# Patient Record
Sex: Female | Born: 1975 | Race: White | Hispanic: No | Marital: Married | Smoking: Never smoker
Health system: Southern US, Community
[De-identification: ages and names within clinical notes are randomized; demographics above are authoritative.]

---

## 2002-08-05 ENCOUNTER — Other Ambulatory Visit: Admission: RE | Admit: 2002-08-05 | Discharge: 2002-08-05 | Payer: Self-pay | Admitting: Obstetrics & Gynecology

## 2003-08-06 ENCOUNTER — Other Ambulatory Visit: Admission: RE | Admit: 2003-08-06 | Discharge: 2003-08-06 | Payer: Self-pay | Admitting: Obstetrics and Gynecology

## 2004-08-06 ENCOUNTER — Other Ambulatory Visit: Admission: RE | Admit: 2004-08-06 | Discharge: 2004-08-06 | Payer: Self-pay | Admitting: Obstetrics and Gynecology

## 2005-07-18 ENCOUNTER — Emergency Department (HOSPITAL_COMMUNITY): Admission: EM | Admit: 2005-07-18 | Discharge: 2005-07-18 | Payer: Self-pay | Admitting: Family Medicine

## 2005-08-24 ENCOUNTER — Other Ambulatory Visit: Admission: RE | Admit: 2005-08-24 | Discharge: 2005-08-24 | Payer: Self-pay | Admitting: Obstetrics and Gynecology

## 2005-11-28 HISTORY — PX: WISDOM TOOTH EXTRACTION: SHX21

## 2007-12-15 ENCOUNTER — Emergency Department (HOSPITAL_COMMUNITY): Admission: EM | Admit: 2007-12-15 | Discharge: 2007-12-15 | Payer: Self-pay | Admitting: Emergency Medicine

## 2008-01-21 ENCOUNTER — Emergency Department (HOSPITAL_COMMUNITY): Admission: EM | Admit: 2008-01-21 | Discharge: 2008-01-21 | Payer: Self-pay | Admitting: Emergency Medicine

## 2008-03-28 ENCOUNTER — Emergency Department (HOSPITAL_COMMUNITY): Admission: EM | Admit: 2008-03-28 | Discharge: 2008-03-28 | Payer: Self-pay | Admitting: Emergency Medicine

## 2009-07-04 ENCOUNTER — Emergency Department (HOSPITAL_COMMUNITY): Admission: EM | Admit: 2009-07-04 | Discharge: 2009-07-04 | Payer: Self-pay | Admitting: Family Medicine

## 2009-12-01 ENCOUNTER — Emergency Department (HOSPITAL_COMMUNITY): Admission: EM | Admit: 2009-12-01 | Discharge: 2009-12-01 | Payer: Self-pay | Admitting: Family Medicine

## 2011-12-26 ENCOUNTER — Other Ambulatory Visit: Payer: Self-pay | Admitting: Obstetrics and Gynecology

## 2011-12-26 DIAGNOSIS — R928 Other abnormal and inconclusive findings on diagnostic imaging of breast: Secondary | ICD-10-CM

## 2012-01-03 ENCOUNTER — Ambulatory Visit
Admission: RE | Admit: 2012-01-03 | Discharge: 2012-01-03 | Disposition: A | Discharge status: Home / Self Care | Payer: 59 | Source: Ambulatory Visit | Attending: Obstetrics and Gynecology | Admitting: Obstetrics and Gynecology

## 2012-01-03 DIAGNOSIS — R928 Other abnormal and inconclusive findings on diagnostic imaging of breast: Secondary | ICD-10-CM

## 2012-03-28 ENCOUNTER — Ambulatory Visit (INDEPENDENT_AMBULATORY_CARE_PROVIDER_SITE_OTHER): Payer: 59 | Admitting: Family Medicine

## 2012-03-28 VITALS — BP 110/70 | Ht 59.0 in | Wt 120.0 lb

## 2012-03-28 DIAGNOSIS — M79673 Pain in unspecified foot: Secondary | ICD-10-CM | POA: Insufficient documentation

## 2012-03-28 DIAGNOSIS — M25569 Pain in unspecified knee: Secondary | ICD-10-CM

## 2012-03-28 DIAGNOSIS — M79609 Pain in unspecified limb: Secondary | ICD-10-CM

## 2012-03-28 MED ORDER — MELOXICAM 15 MG PO TABS: 15.0000 mg | ORAL_TABLET | Freq: Every day | ORAL | Status: DC

## 2012-03-28 NOTE — Progress Notes (Signed)
  Subjective:    Patient ID: Michaela Mitchell, female    DOB: 28-Jul-1976, 36 y.o.   MRN: [id number]  HPI 36 y/o female is here for left lateral foot pain that started after finishing her first 10K on April 6th.  The pain has actually resolve now after a period of not running.  She recently returned to running and is no longer having the pain but wonders what was the cause.  She also has intermittent knee pain which was at it's worse on her race day.  No injury of the foot or the knee that she knows of.   Review of Systems     Objective:   Physical Exam   Decreased strength of the hip flexors and abductors, left weaker than right No effusion of either knee ROM normal both knees Patellar crepitus, right greater than left No tenderness to palpation of the patella or either joint line Patellar tendons non-tender No calf tenderness to palpation Collapse of forefoot bilat, right greater than left Normal posterior tib function bilaterally Ankle and foot are non-tender to palpation bilaterally Normal ROM at the ankle bilaterally No instability of the ankle bilaterally  Gait: Barefoot she runs with a swing of the right leg.  In shoes the run is more neutral but still with a slight swing.  In green insoles the run is neutral.  She is a midfoot striker.     Assessment & Plan:

## 2012-03-28 NOTE — Assessment & Plan Note (Signed)
Added sports insoles which did give her a more neutral stride.  Suspect her original injury was overuse.  Discussed running schedules that will help prevent recurrence.

## 2012-03-28 NOTE — Patient Instructions (Signed)
Knee HEP given at time of visit.

## 2012-03-28 NOTE — Assessment & Plan Note (Addendum)
Knee HEP given.  Mobic PRN pain.  Expect correction of gait will also help improve her symptoms.

## 2012-05-08 ENCOUNTER — Ambulatory Visit: Payer: 59 | Admitting: Sports Medicine

## 2012-11-07 ENCOUNTER — Ambulatory Visit: Payer: 59 | Admitting: Sports Medicine

## 2012-11-09 ENCOUNTER — Ambulatory Visit: Payer: 59 | Admitting: Family Medicine

## 2012-11-12 ENCOUNTER — Ambulatory Visit (INDEPENDENT_AMBULATORY_CARE_PROVIDER_SITE_OTHER): Payer: 59 | Admitting: Sports Medicine

## 2012-11-12 VITALS — BP 120/85 | Ht 59.0 in | Wt 117.0 lb

## 2012-11-12 DIAGNOSIS — M7750 Other enthesopathy of unspecified foot: Secondary | ICD-10-CM

## 2012-11-12 DIAGNOSIS — M7740 Metatarsalgia, unspecified foot: Secondary | ICD-10-CM

## 2012-11-12 NOTE — Progress Notes (Signed)
  Subjective:    Patient ID: Michaela Mitchell, female    DOB: 26-May-1976, 36 y.o.   MRN: [id number]  HPI  Pt is a 36 yo runner who presents with right foot pain. Pt states the pain started in April 2013 after the [facility] Run (6.2 miles.) She was evaluated in this clinic in May 2013 and given green sports insoles. She states she wore the insoles, took mobic as needed and continued doing exercises and her pain resolved until 2 weeks ago when she was standing for long periods of time in old shoes putting up Christmas decorations. She does not have increased pain with running. Notices the pain most in the mornings or when she is barefoot. She has been taking Mobic which helps some but does not take the pain away. She also has does foot stretches in yoga and worn toe socks, which helps as well. She states that she has adjusted her gait due to the pain which is putting increased pressure on her knee.    Review of Systems Negative except as noted in HPI above    Objective:   Physical Exam  Gen: Awake, alert, NAD Extremities: Neurovascularly intact. Right ankle full ROM. Ankle non-tender and stable. TTP right 2nd metatarsal head. Negative metatarsal squeeze bilaterally. Flattened transverse arch bilaterally R>L Gait normal with some mild pronation      Assessment & Plan:   36 yo F with metatarsalgia on right  - Continue green insoles with small metatarsal pad added today bilaterally - Continue Mobic as needed for pain - Continue exercises - RTC in 2 weeks for custom insoles  Michaela Mitchell M. Evagelia Knack, M.D.

## 2012-11-22 ENCOUNTER — Encounter: Payer: 59 | Admitting: Sports Medicine

## 2012-12-04 ENCOUNTER — Encounter: Payer: Self-pay | Admitting: Sports Medicine

## 2013-01-12 ENCOUNTER — Other Ambulatory Visit: Payer: Self-pay

## 2013-03-01 ENCOUNTER — Other Ambulatory Visit: Payer: Self-pay | Admitting: *Deleted

## 2013-03-01 MED ORDER — MELOXICAM 15 MG PO TABS: 15.0000 mg | ORAL_TABLET | Freq: Every day | ORAL | Status: AC

## 2013-09-06 ENCOUNTER — Ambulatory Visit (INDEPENDENT_AMBULATORY_CARE_PROVIDER_SITE_OTHER): Payer: Managed Care, Other (non HMO) | Admitting: Endocrinology

## 2013-09-06 VITALS — BP 122/70 | HR 84 | Ht 59.0 in | Wt 121.0 lb

## 2013-09-06 DIAGNOSIS — L709 Acne, unspecified: Secondary | ICD-10-CM

## 2013-09-06 DIAGNOSIS — L708 Other acne: Secondary | ICD-10-CM

## 2013-09-06 LAB — TESTOSTERONE: Testosterone: 29.75 ng/dL (ref 10.00–70.00)

## 2013-09-06 LAB — TSH: TSH: 2.38 u[IU]/mL (ref 0.35–5.50)

## 2013-09-06 NOTE — Patient Instructions (Addendum)
blood tests are being requested for you today.  We'll contact you with results. If the testosterone is high, i can prescribe for you a skin cream.

## 2013-09-06 NOTE — Progress Notes (Signed)
  Subjective:    Patient ID: Michaela Mitchell, female    DOB: 1976/09/05, 37 y.o.   MRN: [id number]  HPI Pt states many years of moderate acne of the face, worse in the context of menstruation.  She has assoc hair loss on the head.  She takes OC's for the purpose of contraception.  She is G0, and never attempted pregnancy, and has no plans to.   No past medical history on file.  No past surgical history on file.  History   Social History  . Marital Status: Married    Spouse Name: N/A    Number of Children: N/A  . Years of Education: N/A   Occupational History  . Not on file.   Social History Main Topics  . Smoking status: Not on file  . Smokeless tobacco: Not on file  . Alcohol Use: Not on file  . Drug Use: Not on file  . Sexual Activity: Not on file   Other Topics Concern  . Not on file   Social History Narrative  . No narrative on file    Current Outpatient Prescriptions on File Prior to Visit  Medication Sig Dispense Refill  . meloxicam (MOBIC) 15 MG tablet Take 1 tablet (15 mg total) by mouth daily.  30 tablet  2   No current facility-administered medications on file prior to visit.    Allergies  Allergen Reactions  . Sulfa Antibiotics     No family history on file.  BP 122/70  Pulse 84  Ht 4\' 11"  (1.499 m)  Wt 121 lb (54.885 kg)  BMI 24.43 kg/m2  SpO2 95%  Review of Systems denies depression, cramps, sob, weight gain, constipation, numbness, blurry vision, myalgias, rhinorrhea, easy bruising, and syncope.  She attributes dry skin to accutane.  She has mild anxiety and constipation.       Objective:   Physical Exam VS: see vs page GEN: no distress HEAD: head: no deformity eyes: no periorbital swelling, no proptosis external nose and ears are normal mouth: no lesion seen NECK: supple, thyroid is not enlarged CHEST WALL: no deformity LUNGS:  Clear to auscultation.   CV: reg rate and rhythm, no murmur.   ABD: abdomen is soft, nontender.  no  hepatosplenomegaly.  not distended.  no hernia MUSCULOSKELETAL: muscle bulk and strength are grossly normal.  no obvious joint swelling.  gait is normal and steady EXTEMITIES: no deformity.  no ulcer on the feet.  feet are of normal color and temp.  no edema PULSES: dorsalis pedis intact bilat.  no carotid bruit NEURO:  cn 2-12 grossly intact.   readily moves all 4's.  sensation is intact to touch on the feet SKIN:  Normal texture and temperature.  No rash or suspicious lesion is visible.  Moderate acne on the face, but no terminal hair NODES:  None palpable at the neck PSYCH: alert, oriented x3.  Does not appear anxious nor depressed.    Pt says her lipids have been normal or near-normal in the past, even on oral contraceptives. Lab Results  Component Value Date   TSH 2.38 09/06/2013   Lab Results  Component Value Date   TESTOSTERONE 29.75 09/06/2013      Assessment & Plan:  Acne, chronic, uncertain etiology.  She could have a trial of vaniqa, despite lack of hirsutism. Anxiety, mild Dry skin, not thyroid-related.

## 2013-09-09 ENCOUNTER — Encounter: Payer: Self-pay | Admitting: Endocrinology

## 2013-10-03 ENCOUNTER — Other Ambulatory Visit: Payer: Self-pay

## 2013-12-11 ENCOUNTER — Other Ambulatory Visit: Payer: Self-pay | Admitting: Obstetrics and Gynecology

## 2013-12-11 DIAGNOSIS — N6009 Solitary cyst of unspecified breast: Secondary | ICD-10-CM

## 2013-12-18 ENCOUNTER — Ambulatory Visit
Admission: RE | Admit: 2013-12-18 | Discharge: 2013-12-18 | Disposition: A | Discharge status: Home / Self Care | Payer: Managed Care, Other (non HMO) | Source: Ambulatory Visit | Attending: Obstetrics and Gynecology | Admitting: Obstetrics and Gynecology

## 2013-12-18 DIAGNOSIS — N6009 Solitary cyst of unspecified breast: Secondary | ICD-10-CM

## 2013-12-18 LAB — HM MAMMOGRAPHY

## 2014-09-12 ENCOUNTER — Other Ambulatory Visit: Payer: Self-pay

## 2015-01-02 LAB — HM PAP SMEAR

## 2015-02-06 ENCOUNTER — Encounter: Payer: Self-pay | Admitting: Nurse Practitioner

## 2015-02-06 NOTE — Telephone Encounter (Signed)
Yes it is  the same

## 2015-02-17 ENCOUNTER — Ambulatory Visit (INDEPENDENT_AMBULATORY_CARE_PROVIDER_SITE_OTHER): Payer: 59 | Admitting: Nurse Practitioner

## 2015-02-17 ENCOUNTER — Encounter: Payer: Self-pay | Admitting: Nurse Practitioner

## 2015-02-17 VITALS — BP 122/78 | HR 70 | Temp 98.3°F | Resp 12 | Ht 59.0 in | Wt 121.0 lb

## 2015-02-17 DIAGNOSIS — M779 Enthesopathy, unspecified: Secondary | ICD-10-CM | POA: Diagnosis not present

## 2015-02-17 DIAGNOSIS — Z7689 Persons encountering health services in other specified circumstances: Secondary | ICD-10-CM

## 2015-02-17 NOTE — Progress Notes (Signed)
Pre visit review using our clinic review tool, if applicable. No additional management support is needed unless otherwise documented below in the visit note. 

## 2015-02-17 NOTE — Progress Notes (Signed)
Subjective:    Patient ID: Michaela Mitchell, female    DOB: [birth year], 39 y.o.   MRN: [id number]  HPI  Michaela Mitchell is a 39 yo establishing care and CC of arm tenderness.   1) Health Maintenance-   Diet- Following a healthy diet   Exercise- 5k this weekend, classes at the hospital   Immunizations- UTD   Mammogram- 3 in past   Pap- [name]   Eye Exam- Years prior  Dental Exam- UTD  2) Chronic Problems-  N/A  3) Acute Problems-  Right arm- Recent Strength training with lowest weight- 2 weeks in a row spaced out days. Ibuprofen, ice. Healing touch- improved. 2/4   Review of Systems  Constitutional: Negative for fever, chills, diaphoresis and fatigue.  Eyes: Negative for visual disturbance.  Respiratory: Negative for chest tightness, shortness of breath and wheezing.   Cardiovascular: Negative for chest pain, palpitations and leg swelling.  Gastrointestinal: Negative for nausea, vomiting and diarrhea.  Musculoskeletal: Positive for myalgias. Negative for back pain and neck pain.       Pain in right shoulder  Skin: Negative for rash.  Neurological: Negative for dizziness, weakness, numbness and headaches.  Hematological: Does not bruise/bleed easily.  Psychiatric/Behavioral: The patient is not nervous/anxious.    No past medical history on file.  History   Social History  . Marital Status: Married    Spouse Name: N/A  . Number of Children: N/A  . Years of Education: N/A   Occupational History  . Not on file.   Social History Main Topics  . Smoking status: Never Smoker   . Smokeless tobacco: Never Used  . Alcohol Use: No  . Drug Use: No  . Sexual Activity: Not on file   Other Topics Concern  . Not on file   Social History Narrative  . No narrative on file    Past Surgical History  Procedure Laterality Date  . Wisdom tooth extraction  2007    Family History  Problem Relation Age of Onset  . Arthritis Mother     rheumatoid arthritis  .  Hypertension Mother   . Diabetes Mother   . COPD Mother   . Alcohol abuse Father   . Heart disease Father     MI    Allergies  Allergen Reactions  . Sulfa Antibiotics     Just Celebrex  . Celebrex [Celecoxib] Rash    Current Outpatient Prescriptions on File Prior to Visit  Medication Sig Dispense Refill  . Ascorbic Acid (VITAMIN C) 100 MG tablet Take 100 mg by mouth daily.    . norethindrone-ethinyl estradiol (MICROGESTIN,JUNEL,LOESTRIN) 1-20 MG-MCG tablet Take 1 tablet by mouth daily.     No current facility-administered medications on file prior to visit.      Objective:   Physical Exam  Constitutional: She is oriented to person, place, and time. She appears well-developed and well-nourished. No distress.  BP 122/78 mmHg  Pulse 70  Temp(Src) 98.3 F (36.8 C) (Oral)  Resp 12  Ht 4\' 11"  (1.499 m)  Wt 121 lb (54.885 kg)  BMI 24.43 kg/m2  SpO2 97%  LMP 02/14/2015   HENT:  Head: Normocephalic and atraumatic.  Right Ear: External ear normal.  Left Ear: External ear normal.  Cardiovascular: Normal rate, regular rhythm, normal heart sounds and intact distal pulses.  Exam reveals no gallop and no friction rub.   No murmur heard. Pulmonary/Chest: Effort normal and breath sounds normal. No respiratory distress. She has no wheezes.  She has no rales. She exhibits no tenderness.  Musculoskeletal: Normal range of motion. She exhibits no edema or tenderness.  Right arm pain on forearm flexion against resistance.   Neurological: She is alert and oriented to person, place, and time. No cranial nerve deficit. She exhibits normal muscle tone. Coordination normal.  Skin: Skin is warm and dry. No rash noted. She is not diaphoretic.  Psychiatric: She has a normal mood and affect. Her behavior is normal. Judgment and thought content normal.      Assessment & Plan:

## 2015-02-17 NOTE — Patient Instructions (Signed)

## 2015-02-23 ENCOUNTER — Encounter: Payer: Self-pay | Admitting: Nurse Practitioner

## 2015-02-23 DIAGNOSIS — Z7689 Persons encountering health services in other specified circumstances: Secondary | ICD-10-CM | POA: Insufficient documentation

## 2015-02-23 DIAGNOSIS — M779 Enthesopathy, unspecified: Secondary | ICD-10-CM | POA: Insufficient documentation

## 2015-02-23 NOTE — Assessment & Plan Note (Signed)
Discussed acute and chronic issues. Reviewed health maintenance measures, PFSHx, and immunizations. Will obtain records from previous facility.

## 2015-02-23 NOTE — Assessment & Plan Note (Signed)
Stable. Ice, ibuprofen regularly for 2 weeks, continue with healing touch.

## 2015-06-26 ENCOUNTER — Other Ambulatory Visit: Payer: Self-pay | Admitting: Nurse Practitioner

## 2015-06-26 DIAGNOSIS — J329 Chronic sinusitis, unspecified: Secondary | ICD-10-CM

## 2015-07-21 ENCOUNTER — Encounter: Payer: Self-pay | Admitting: Nurse Practitioner

## 2015-07-21 ENCOUNTER — Ambulatory Visit (INDEPENDENT_AMBULATORY_CARE_PROVIDER_SITE_OTHER): Payer: 59 | Admitting: Nurse Practitioner

## 2015-07-21 VITALS — BP 120/70 | HR 68 | Temp 98.5°F | Resp 14 | Ht 59.0 in | Wt 126.6 lb

## 2015-07-21 DIAGNOSIS — Z0000 Encounter for general adult medical examination without abnormal findings: Secondary | ICD-10-CM

## 2015-07-21 DIAGNOSIS — Z0001 Encounter for general adult medical examination with abnormal findings: Secondary | ICD-10-CM | POA: Insufficient documentation

## 2015-07-21 LAB — COMPREHENSIVE METABOLIC PANEL
ALK PHOS: 48 U/L (ref 39–117)
ALT: 11 U/L (ref 0–35)
AST: 18 U/L (ref 0–37)
Albumin: 4 g/dL (ref 3.5–5.2)
BUN: 14 mg/dL (ref 6–23)
CALCIUM: 9.5 mg/dL (ref 8.4–10.5)
CHLORIDE: 102 meq/L (ref 96–112)
CO2: 26 mEq/L (ref 19–32)
Creatinine, Ser: 0.86 mg/dL (ref 0.40–1.20)
GFR: 78.05 mL/min (ref >60.00)
Glucose, Bld: 84 mg/dL (ref 70–99)
POTASSIUM: 3.9 meq/L (ref 3.5–5.1)
Sodium: 136 mEq/L (ref 135–145)
Total Bilirubin: 0.5 mg/dL (ref 0.2–1.2)
Total Protein: 6.7 g/dL (ref 6.0–8.3)

## 2015-07-21 LAB — CBC WITH DIFFERENTIAL/PLATELET
BASOS PCT: 0.6 % (ref 0.0–3.0)
Basophils Absolute: 0.1 10*3/uL (ref 0.0–0.1)
EOS PCT: 0.4 % (ref 0.0–5.0)
Eosinophils Absolute: 0 10*3/uL (ref 0.0–0.7)
HEMATOCRIT: 42.8 % (ref 36.0–46.0)
HEMOGLOBIN: 14.6 g/dL (ref 12.0–15.0)
LYMPHS PCT: 25.8 % (ref 12.0–46.0)
Lymphs Abs: 2.1 10*3/uL (ref 0.7–4.0)
MCHC: 34.3 g/dL (ref 30.0–36.0)
MCV: 90 fl (ref 78.0–100.0)
MONOS PCT: 6.5 % (ref 3.0–12.0)
Monocytes Absolute: 0.5 10*3/uL (ref 0.1–1.0)
Neutro Abs: 5.4 10*3/uL (ref 1.4–7.7)
Neutrophils Relative %: 66.7 % (ref 43.0–77.0)
Platelets: 209 10*3/uL (ref 150.0–400.0)
RBC: 4.75 Mil/uL (ref 3.87–5.11)
RDW: 13 % (ref 11.5–15.5)
WBC: 8.1 10*3/uL (ref 4.0–10.5)

## 2015-07-21 LAB — LIPID PANEL
Cholesterol: 176 mg/dL (ref 0–200)
HDL: 68.7 mg/dL (ref >39.00)
LDL Cholesterol: 90 mg/dL (ref 0–99)
NONHDL: 107.69
Total CHOL/HDL Ratio: 3
Triglycerides: 89 mg/dL (ref 0.0–149.0)
VLDL: 17.8 mg/dL (ref 0.0–40.0)

## 2015-07-21 LAB — HEMOGLOBIN A1C: HEMOGLOBIN A1C: 4.8 % (ref 4.6–6.5)

## 2015-07-21 NOTE — Assessment & Plan Note (Signed)
Discussed acute and chronic issues. Reviewed health maintenance measures, PFSHx, and immunizations. Obtain routine labs Lipid panel, CBC w/ diff, A1c, and CMET.    Clinical breast exam and Pap done at OB/GYN office in February 2016

## 2015-07-21 NOTE — Patient Instructions (Signed)

## 2015-07-21 NOTE — Progress Notes (Signed)
Patient ID: Michaela Mitchell, female    DOB: 09-02-1976  Age: 39 y.o. MRN: [id number]  CC: Annual Exam   HPI Michaela Mitchell presents for annual exam.  1) Health Maintenance-   Diet- working on diet with keeping carbs down, eating lots of fruits and veggies  Exercise- No formal   Immunizations- UTD  Mammogram- Next year   Pap- 12/2014   Eye Exam- 05/2014  Dental Exam- UTD  2) Chronic Problems-  Acne- tried Aczone, doxycyline, minocycline   ENT- visit tomorrow for sinus problems found on CT scan  History Michaela Mitchell has no past medical history on file.   She has past surgical history that includes Wisdom tooth extraction (2007).   Her family history includes Alcohol abuse in her father; Arthritis in her mother; COPD in her mother; Diabetes in her mother; Heart disease in her father; Hypertension in her mother.She reports that she has never smoked. She has never used smokeless tobacco. She reports that she does not drink alcohol or use illicit drugs.  Outpatient Prescriptions Prior to Visit  Medication Sig Dispense Refill  . Ascorbic Acid (VITAMIN C) 100 MG tablet Take 100 mg by mouth daily.    . B Complex-C (SUPER B COMPLEX PO) Take 1 tablet by mouth daily.    . norethindrone-ethinyl estradiol (MICROGESTIN,JUNEL,LOESTRIN) 1-20 MG-MCG tablet Take 1 tablet by mouth daily.    Marland Kitchen OVER THE COUNTER MEDICATION 3 tablets daily. [employer] Core Nutrition Multivitamin    . OVER THE COUNTER MEDICATION 2 tablets daily. Confianza (Anti stress/ herbal supplement)     No facility-administered medications prior to visit.    ROS Review of Systems  Constitutional: Negative for fever, chills, diaphoresis and fatigue.  HENT: Negative for tinnitus and trouble swallowing.   Eyes: Negative for visual disturbance.  Respiratory: Negative for chest tightness, shortness of breath and wheezing.   Cardiovascular: Negative for chest pain, palpitations and leg swelling.  Gastrointestinal: Negative for nausea,  vomiting, diarrhea and constipation.  Musculoskeletal: Negative for back pain and neck pain.  Skin: Negative for rash.  Neurological: Negative for dizziness, weakness, numbness and headaches.  Hematological: Does not bruise/bleed easily.  Psychiatric/Behavioral: The patient is not nervous/anxious.     Objective:  BP 120/70 mmHg  Pulse 68  Temp(Src) 98.5 F (36.9 C)  Resp 14  Ht 4\' 11"  (1.499 m)  Wt 126 lb 9.6 oz (57.425 kg)  BMI 25.56 kg/m2  SpO2 96%  Physical Exam  Constitutional: She is oriented to person, place, and time. She appears well-developed and well-nourished. No distress.  HENT:  Head: Normocephalic and atraumatic.  Right Ear: External ear normal.  Left Ear: External ear normal.  Nose: Nose normal.  Mouth/Throat: Oropharynx is clear and moist. No oropharyngeal exudate.  TMs and canals clear bilaterally  Eyes: Conjunctivae and EOM are normal. Pupils are equal, round, and reactive to light. Right eye exhibits no discharge. Left eye exhibits no discharge. No scleral icterus.  Neck: Normal range of motion. Neck supple. No thyromegaly present.  Cardiovascular: Normal rate, regular rhythm, normal heart sounds and intact distal pulses.  Exam reveals no gallop and no friction rub.   No murmur heard. Pulmonary/Chest: Effort normal and breath sounds normal. No respiratory distress. She has no wheezes. She has no rales. She exhibits no tenderness.  Patient sees OB/GYN for clinical breast exam  Abdominal: Soft. Bowel sounds are normal. She exhibits no distension and no mass. There is no tenderness. There is no rebound and no guarding.  Genitourinary:  Deferred to OB/GYN  Musculoskeletal: Normal range of motion. She exhibits no edema or tenderness.  Lymphadenopathy:    She has no cervical adenopathy.  Neurological: She is alert and oriented to person, place, and time. She has normal reflexes. No cranial nerve deficit. She exhibits normal muscle tone. Coordination normal.   Skin: Skin is warm and dry. No rash noted. She is not diaphoretic. No erythema. No pallor.  Psychiatric: She has a normal mood and affect. Her behavior is normal. Judgment and thought content normal.   Assessment & Plan:   Michaela Mitchell was seen today for annual exam.  Diagnoses and all orders for this visit:  Routine general medical examination at a health care facility -     CBC with Differential/Platelet -     Comprehensive metabolic panel -     Hemoglobin A1c -     Lipid panel  I am having Michaela Mitchell maintain her vitamin C, norethindrone-ethinyl estradiol, OVER THE COUNTER MEDICATION, OVER THE COUNTER MEDICATION, and B Complex-C (SUPER B COMPLEX PO).  No orders of the defined types were placed in this encounter.     Follow-up: Return in about 1 year (around 07/20/2016) for CPE.

## 2015-07-21 NOTE — Progress Notes (Signed)
Pre visit review using our clinic review tool, if applicable. No additional management support is needed unless otherwise documented below in the visit note. 

## 2015-07-23 ENCOUNTER — Other Ambulatory Visit: Payer: Self-pay | Admitting: Otolaryngology

## 2015-07-23 ENCOUNTER — Ambulatory Visit
Admission: RE | Admit: 2015-07-23 | Discharge: 2015-07-23 | Disposition: A | Discharge status: Home / Self Care | Payer: 59 | Source: Ambulatory Visit | Attending: Otolaryngology | Admitting: Otolaryngology

## 2015-07-23 DIAGNOSIS — J320 Chronic maxillary sinusitis: Secondary | ICD-10-CM | POA: Insufficient documentation

## 2015-09-14 ENCOUNTER — Encounter: Payer: Self-pay | Admitting: Nurse Practitioner

## 2015-09-15 ENCOUNTER — Encounter: Payer: Self-pay | Admitting: Nurse Practitioner

## 2015-10-05 ENCOUNTER — Encounter: Payer: 59 | Attending: Nurse Practitioner | Admitting: Dietician

## 2015-10-05 VITALS — Ht 59.75 in | Wt 125.3 lb

## 2015-10-05 DIAGNOSIS — R635 Abnormal weight gain: Secondary | ICD-10-CM

## 2015-10-05 DIAGNOSIS — Z713 Dietary counseling and surveillance: Secondary | ICD-10-CM | POA: Diagnosis present

## 2015-10-05 NOTE — Progress Notes (Signed)
Notes from [facility] employee "self referral" nutrition session: Start time: 0900   End time 1000  Met with employee to discuss his/her nutritional concerns and diet history. The employee's questions/concerns were also addressed.  We discussed the following topics:  Healthy Eating  Weight Concerns  I also provided the following handouts as reinforcement of the educational session:  Planning a Balanced Meal  Sample menus and/or recipes   Additional Comments: Michaela Mitchell is working on losing weight to her goal of 105-110lbs, which would be a BMI 21-22 for her height.  She has begun regular exercise, but has not noticed any weight loss. She reports feeling hungry during the day on her current eating pattern.  Her diet history indicates some meals are low or lacking in protein. She is avoiding dairy foods due to digestive intolerance, and sinus headaches when she eats cheese.  Provided 1300kcal meal plan with food lists, and discussed options for adding protein while controlling carb intake.     Goals Agreed Upon: 1. Include protein source of 1-3oz with each meal for a total of 6oz (equivalents) daily  2. Control carb intake to 2-3 servings with each meal.

## 2015-11-11 ENCOUNTER — Ambulatory Visit (INDEPENDENT_AMBULATORY_CARE_PROVIDER_SITE_OTHER): Payer: 59 | Admitting: Family Medicine

## 2015-11-11 ENCOUNTER — Encounter: Payer: Self-pay | Admitting: Family Medicine

## 2015-11-11 VITALS — BP 100/60 | Ht 59.75 in | Wt 120.0 lb

## 2015-11-11 DIAGNOSIS — M222X9 Patellofemoral disorders, unspecified knee: Secondary | ICD-10-CM | POA: Insufficient documentation

## 2015-11-11 DIAGNOSIS — M222X2 Patellofemoral disorders, left knee: Secondary | ICD-10-CM | POA: Diagnosis not present

## 2015-11-11 NOTE — Progress Notes (Signed)
  [name] - 39 y.o. female MRN [id number]  Date of birth: 02/04/1976  SUBJECTIVE:  Including CC & ROS.  Michaela Mitchell is a 39 y.o. female who presents today for left knee pain.    Knee Pain left, initial visit - patient presents today for ongoing left knee pain for the past 1.5-2 months. She has been increasing her running over that time. As she ramps up her training for a race. Pain is worse with prolonged sitting and ascending to a chair or going up or down stairs. Rest does make this better and ibuprofen does as well. No previous injury to either knee.  PMHx - Updated and reviewed.  Contributory factors include: Noncontributory PSHx - Updated and reviewed.  Contributory factors include:  Noncontributory FHx - Updated and reviewed.  Contributory factors include:  Noncontributory Medications - updated and reviewed   12 point ROS negative other than per HPI.   Exam:  Filed Vitals:   11/11/15 1551  BP: 100/60    Gen: NAD Cardiorespiratory - Normal respiratory effort/rate.  RRR L Knee:  Normal to inspection with no erythema or effusion or obvious bony abnormalities.  No obvious Baker's cysts Palpation normal with no warmth or joint line tenderness or patellar tenderness or condyle tenderness.  No TTP along infrapatellar or pes anserine bursas.   ROM normal in flexion (135 degrees) and extension (0 degrees) and lower leg rotation. Ligaments with solid consistent endpoints including ACL, PCL, LCL, MCL.  Negative Anterior Drawer/Lachman/Pivot Shift Negative Mcmurray's and provocative meniscal tests including Thessaly and Apley compression testing  + painful patellar compression.  Normal Patellar glide.  No apprehension  Negative Dial Test  Patellar and quadriceps tendons unremarkable. Hamstring and quadriceps strength is normal.  Neurovascularly intact B/L LE

## 2015-11-11 NOTE — Assessment & Plan Note (Signed)
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

## 2016-01-05 DIAGNOSIS — Z6826 Body mass index (BMI) 26.0-26.9, adult: Secondary | ICD-10-CM | POA: Diagnosis not present

## 2016-01-05 DIAGNOSIS — Z01419 Encounter for gynecological examination (general) (routine) without abnormal findings: Secondary | ICD-10-CM | POA: Diagnosis not present

## 2016-04-29 ENCOUNTER — Encounter: Payer: Self-pay | Admitting: Physician Assistant

## 2016-04-29 ENCOUNTER — Ambulatory Visit: Payer: Self-pay | Admitting: Physician Assistant

## 2016-04-29 VITALS — BP 114/72 | HR 76 | Temp 98.2°F

## 2016-04-29 DIAGNOSIS — R300 Dysuria: Secondary | ICD-10-CM

## 2016-04-29 DIAGNOSIS — N390 Urinary tract infection, site not specified: Secondary | ICD-10-CM

## 2016-04-29 DIAGNOSIS — R319 Hematuria, unspecified: Secondary | ICD-10-CM

## 2016-04-29 LAB — POCT URINALYSIS DIPSTICK
Bilirubin, UA: NEGATIVE
GLUCOSE UA: NEGATIVE
Ketones, UA: NEGATIVE
NITRITE UA: POSITIVE
UROBILINOGEN UA: 0.2
pH, UA: 5.5

## 2016-04-29 MED ORDER — CIPROFLOXACIN HCL 250 MG PO TABS: 250.0000 mg | ORAL_TABLET | Freq: Two times a day (BID) | ORAL | Status: DC

## 2016-04-29 NOTE — Progress Notes (Signed)
S:  C/o uti sx for 3 days, burning, urgency, frequency, denies vaginal discharge, abdominal pain or flank pain:  Remainder ros neg  O:  Vitals wnl, nad, no cva tenderness, back nontender, lungs c t a,cv rrr,  n/v intact ua +nitrites, 2+ blood, trace leuk  A: uti  P: cipro 250mg  bid x 7d, increase water intake, add cranberry juice, return if not improving in 2 -3 days, return earlier if worsening, discussed pyelonephritis sx

## 2016-06-28 DIAGNOSIS — H5213 Myopia, bilateral: Secondary | ICD-10-CM | POA: Diagnosis not present

## 2016-08-08 ENCOUNTER — Encounter: Payer: Self-pay | Admitting: Family

## 2016-08-08 ENCOUNTER — Ambulatory Visit (INDEPENDENT_AMBULATORY_CARE_PROVIDER_SITE_OTHER): Payer: 59 | Admitting: Family

## 2016-08-08 VITALS — BP 104/64 | HR 71 | Temp 98.2°F | Ht 60.0 in | Wt 128.6 lb

## 2016-08-08 DIAGNOSIS — R6889 Other general symptoms and signs: Secondary | ICD-10-CM | POA: Diagnosis not present

## 2016-08-08 DIAGNOSIS — Z0001 Encounter for general adult medical examination with abnormal findings: Secondary | ICD-10-CM

## 2016-08-08 DIAGNOSIS — G43909 Migraine, unspecified, not intractable, without status migrainosus: Secondary | ICD-10-CM | POA: Insufficient documentation

## 2016-08-08 DIAGNOSIS — N6019 Diffuse cystic mastopathy of unspecified breast: Secondary | ICD-10-CM | POA: Insufficient documentation

## 2016-08-08 LAB — COMPREHENSIVE METABOLIC PANEL
ALBUMIN: 4 g/dL (ref 3.5–5.2)
ALK PHOS: 54 U/L (ref 39–117)
ALT: 10 U/L (ref 0–35)
AST: 16 U/L (ref 0–37)
BUN: 15 mg/dL (ref 6–23)
CHLORIDE: 103 meq/L (ref 96–112)
CO2: 26 mEq/L (ref 19–32)
CREATININE: 0.76 mg/dL (ref 0.40–1.20)
Calcium: 8.8 mg/dL (ref 8.4–10.5)
GFR: 89.54 mL/min (ref >60.00)
GLUCOSE: 81 mg/dL (ref 70–99)
Potassium: 3.9 mEq/L (ref 3.5–5.1)
SODIUM: 137 meq/L (ref 135–145)
TOTAL PROTEIN: 6.9 g/dL (ref 6.0–8.3)
Total Bilirubin: 0.4 mg/dL (ref 0.2–1.2)

## 2016-08-08 LAB — CBC WITH DIFFERENTIAL/PLATELET
BASOS PCT: 0.4 % (ref 0.0–3.0)
Basophils Absolute: 0 10*3/uL (ref 0.0–0.1)
EOS ABS: 0 10*3/uL (ref 0.0–0.7)
EOS PCT: 0.4 % (ref 0.0–5.0)
HCT: 43.7 % (ref 36.0–46.0)
Hemoglobin: 14.9 g/dL (ref 12.0–15.0)
LYMPHS ABS: 2.5 10*3/uL (ref 0.7–4.0)
Lymphocytes Relative: 26.4 % (ref 12.0–46.0)
MCHC: 34.1 g/dL (ref 30.0–36.0)
MCV: 89.3 fl (ref 78.0–100.0)
MONO ABS: 0.6 10*3/uL (ref 0.1–1.0)
Monocytes Relative: 5.9 % (ref 3.0–12.0)
NEUTROS PCT: 66.9 % (ref 43.0–77.0)
Neutro Abs: 6.3 10*3/uL (ref 1.4–7.7)
PLATELETS: 193 10*3/uL (ref 150.0–400.0)
RBC: 4.89 Mil/uL (ref 3.87–5.11)
RDW: 12.8 % (ref 11.5–15.5)
WBC: 9.4 10*3/uL (ref 4.0–10.5)

## 2016-08-08 LAB — LIPID PANEL
CHOLESTEROL: 167 mg/dL (ref 0–200)
HDL: 63.1 mg/dL (ref >39.00)
LDL Cholesterol: 86 mg/dL (ref 0–99)
NONHDL: 103.81
Total CHOL/HDL Ratio: 3
Triglycerides: 90 mg/dL (ref 0.0–149.0)
VLDL: 18 mg/dL (ref 0.0–40.0)

## 2016-08-08 LAB — TSH: TSH: 1.65 u[IU]/mL (ref 0.35–4.50)

## 2016-08-08 LAB — HEMOGLOBIN A1C: HEMOGLOBIN A1C: 4.9 % (ref 4.6–6.5)

## 2016-08-08 LAB — HIV ANTIBODY (ROUTINE TESTING W REFLEX): HIV: NONREACTIVE

## 2016-08-08 LAB — VITAMIN D 25 HYDROXY (VIT D DEFICIENCY, FRACTURES): VITD: 40.31 ng/mL (ref 30.00–100.00)

## 2016-08-08 NOTE — Progress Notes (Signed)
Pre visit review using our clinic review tool, if applicable. No additional management support is needed unless otherwise documented below in the visit note. 

## 2016-08-08 NOTE — Progress Notes (Signed)
Subjective:    Patient ID: Michaela Mitchell, female    DOB: 12/24/1975, 40 y.o.   MRN: [id number]  CC: Michaela Mitchell is a 40 y.o. female who presents today for physical exam.    HPI: She presents for routine physical exam. Generally feeling well and no complaints.  OCP: Has been on since 17 years. Doesn't plan to have children. Family h/o CVD.  HA: Not headache today. 'Never formally diagnosed with migraine.'  Usually one sided. 1-2 HA per month which last approx one day. Marland Kitchen Described h/o  flashing lights 'sparks' on occasion with headache which a couple of episodes over this past year. Reports 'tunnel vision' with vision changes to right eye years ago, had CT scan of sinuses and saw ENT per patient. Endorses photophobia. Resolves with ibuprofen. No nausea. Chocolate helps. Cheese and menstrual cycle triggers.  Weight: would like to loose 8-10 pounds. Tried Whole 30 which didn't work.      Breast Cancer Screening: Needs order for mammogram for 11/2016. Cervical Cancer Screening: UTD, follows with [name]  Immunizations       Tetanus - UTD       HIV Screening- Candidate for.  Labs: Screening labs today. Exercise: Gets regular exercise.  Alcohol use: None.  Smoking/tobacco use: Nonsmoker.  Regular dental exams: UTD Wears seat belt: Yes.  HISTORY:  History reviewed. No pertinent past medical history.  Past Surgical History:  Procedure Laterality Date  . WISDOM TOOTH EXTRACTION  2007   Family History  Problem Relation Age of Onset  . Alcohol abuse Father   . Heart disease Father     MI  . Arthritis Mother     rheumatoid arthritis  . Hypertension Mother   . Diabetes Mother   . COPD Mother       ALLERGIES: Sulfa antibiotics and Celebrex [celecoxib]  Current Outpatient Prescriptions on File Prior to Visit  Medication Sig Dispense Refill  . Ascorbic Acid (VITAMIN C) 100 MG tablet Take 100 mg by mouth daily.    . B Complex-C (SUPER B COMPLEX PO) Take 1 tablet  by mouth daily.    Marland Kitchen LARIN FE 1/20 1-20 MG-MCG tablet   3  . OVER THE COUNTER MEDICATION 2 tablets daily. Confianza (Anti stress/ herbal supplement)     No current facility-administered medications on file prior to visit.     Social History  Substance Use Topics  . Smoking status: Never Smoker  . Smokeless tobacco: Never Used  . Alcohol use No    Review of Systems  Constitutional: Negative for chills, fever and unexpected weight change.  HENT: Negative for congestion.   Respiratory: Negative for cough.   Cardiovascular: Negative for chest pain, palpitations and leg swelling.  Gastrointestinal: Negative for nausea and vomiting.  Musculoskeletal: Negative for arthralgias and myalgias.  Skin: Negative for rash.  Neurological: Positive for headaches. Negative for dizziness, syncope and weakness.  Hematological: Negative for adenopathy.  Psychiatric/Behavioral: Negative for confusion.      Objective:    BP 104/64   Pulse 71   Temp 98.2 F (36.8 C) (Oral)   Ht 5' (1.524 m)   Wt 128 lb 9.6 oz (58.3 kg)   LMP 07/30/2016 (Exact Date)   SpO2 99%   BMI 25.12 kg/m   BP Readings from Last 3 Encounters:  08/08/16 104/64  04/29/16 114/72  11/11/15 100/60   Wt Readings from Last 3 Encounters:  08/08/16 128 lb 9.6 oz (58.3 kg)  11/11/15 120 lb (54.4  kg)  10/05/15 125 lb 4.8 oz (56.8 kg)    Physical Exam  Constitutional: She appears well-developed and well-nourished.  Eyes: Conjunctivae are normal.  Neck: No thyroid mass and no thyromegaly present.  Cardiovascular: Normal rate, regular rhythm, normal heart sounds and normal pulses.   Pulmonary/Chest: Effort normal and breath sounds normal. She has no wheezes. She has no rhonchi. She has no rales. Right breast exhibits no inverted nipple, no mass, no nipple discharge, no skin change and no tenderness. Left breast exhibits no inverted nipple, no mass, no nipple discharge, no skin change and no tenderness. Breasts are symmetrical.    CBE performed.   Lymphadenopathy:       Head (right side): No submental, no submandibular, no tonsillar, no preauricular, no posterior auricular and no occipital adenopathy present.       Head (left side): No submental, no submandibular, no tonsillar, no preauricular, no posterior auricular and no occipital adenopathy present.    She has no cervical adenopathy.       Right cervical: No superficial cervical, no deep cervical and no posterior cervical adenopathy present.      Left cervical: No superficial cervical, no deep cervical and no posterior cervical adenopathy present.    She has no axillary adenopathy.  Neurological: She is alert. She has normal reflexes.  Skin: Skin is warm and dry.  Psychiatric: She has a normal mood and affect. Her speech is normal and behavior is normal. Thought content normal.  Vitals reviewed.      Assessment & Plan:   Problem List Items Addressed This Visit      Cardiovascular and Mediastinum   Migraine    HA history consistent with migraine. Patient I discussed the risks of OCPs as she continues to age. She does not have hypertension and she is not a smoker. She notes that she has a family history of CVD. Her migraine description loosely describes an aura which has caused a bit of concern. We jointly decided that she'll continue the OCP right now and make an appointment with her OB/GYN to discuss possible safer alternatives including IUD. She will also keep a headache diary to become more aware of her headache pattern symptoms, frequency and follow-up with me.        Other   Encounter for routine adult physical exam with abnormal findings - Primary    Patient will have mammogram done in January 2018. CBE performed. She is up-to-date on her Pap smear and follows with [name] for this. She is also up-to-date on immunizations. Screening lab work including HIV will be done today. Declines flu shot as gets it as work.      Relevant Orders   HIV antibody    Comprehensive metabolic panel   CBC with Differential/Platelet   Hemoglobin A1c   Lipid panel   TSH   VITAMIN D 25 Hydroxy (Vit-D Deficiency, Fractures)   Cystic breast    Other Visit Diagnoses   None.      I have discontinued Ms. Kilgallon's norethindrone-ethinyl estradiol and ciprofloxacin. I am also having her maintain her vitamin C, OVER THE COUNTER MEDICATION, B Complex-C (SUPER B COMPLEX PO), LARIN FE 1/20, and multivitamin.   Meds ordered this encounter  Medications  . Multiple Vitamin (MULTIVITAMIN) tablet    Sig: Take 1 tablet by mouth daily. Multi Vitamin One A Day Women's    Return precautions given.   Risks, benefits, and alternatives of the medications and treatment plan prescribed today were discussed,  and patient expressed understanding.   Education regarding symptom management and diagnosis given to patient on AVS.   Continue to follow with Mable Paris, FNP for routine health maintenance.   Danisa C Freshour and I agreed with plan.   Mable Paris, FNP   Total of 15 minutes spent with patient today, greater than 50% of which was spent in discussion of  Risks of OCP with headache history.

## 2016-08-08 NOTE — Assessment & Plan Note (Addendum)
HA history consistent with migraine. Patient I discussed the risks of OCPs as she continues to age. She does not have hypertension and she is not a smoker. She notes that she has a family history of CVD. Her migraine description loosely describes an aura which has caused a bit of concern. We jointly decided that she'll continue the OCP right now and make an appointment with her OB/GYN to discuss possible safer alternatives including IUD. She will also keep a headache diary to become more aware of her headache pattern symptoms, frequency and follow-up with me.

## 2016-08-08 NOTE — Assessment & Plan Note (Addendum)
Patient will have mammogram done in January 2018. CBE performed. She is up-to-date on her Pap smear and follows with [name] for this. She is also up-to-date on immunizations. Screening lab work including HIV will be done today. Declines flu shot as gets it as work. Gave Dr Lupita Dawn diet to patient.

## 2016-08-08 NOTE — Patient Instructions (Signed)
Pleasure meeting you.  Please discuss with Dr Matthew Saras whether you should remain on birth control with headaches.   Health Maintenance, Female Adopting a healthy lifestyle and getting preventive care can go a long way to promote health and wellness. Talk with your health care provider about what schedule of regular examinations is right for you. This is a good chance for you to check in with your provider about disease prevention and staying healthy. In between checkups, there are plenty of things you can do on your own. Experts have done a lot of research about which lifestyle changes and preventive measures are most likely to keep you healthy. Ask your health care provider for more information. WEIGHT AND DIET  Eat a healthy diet  Be sure to include plenty of vegetables, fruits, low-fat dairy products, and lean protein.  Do not eat a lot of foods high in solid fats, added sugars, or salt.  Get regular exercise. This is one of the most important things you can do for your health.  Most adults should exercise for at least 150 minutes each week. The exercise should increase your heart rate and make you sweat (moderate-intensity exercise).  Most adults should also do strengthening exercises at least twice a week. This is in addition to the moderate-intensity exercise.  Maintain a healthy weight  Body mass index (BMI) is a measurement that can be used to identify possible weight problems. It estimates body fat based on height and weight. Your health care provider can help determine your BMI and help you achieve or maintain a healthy weight.  For females 60 years of age and older:   A BMI below 18.5 is considered underweight.  A BMI of 18.5 to 24.9 is normal.  A BMI of 25 to 29.9 is considered overweight.  A BMI of 30 and above is considered obese.  Watch levels of cholesterol and blood lipids  You should start having your blood tested for lipids and cholesterol at 40 years of age,  then have this test every 5 years.  You may need to have your cholesterol levels checked more often if:  Your lipid or cholesterol levels are high.  You are older than 40 years of age.  You are at high risk for heart disease.  CANCER SCREENING   Lung Cancer  Lung cancer screening is recommended for adults 15-34 years old who are at high risk for lung cancer because of a history of smoking.  A yearly low-dose CT scan of the lungs is recommended for people who:  Currently smoke.  Have quit within the past 15 years.  Have at least a 30-pack-year history of smoking. A pack year is smoking an average of one pack of cigarettes a day for 1 year.  Yearly screening should continue until it has been 15 years since you quit.  Yearly screening should stop if you develop a health problem that would prevent you from having lung cancer treatment.  Breast Cancer  Practice breast self-awareness. This means understanding how your breasts normally appear and feel.  It also means doing regular breast self-exams. Let your health care provider know about any changes, no matter how small.  If you are in your 20s or 30s, you should have a clinical breast exam (CBE) by a health care provider every 1-3 years as part of a regular health exam.  If you are 46 or older, have a CBE every year. Also consider having a breast X-ray (mammogram) every year.  If  you have a family history of breast cancer, talk to your health care provider about genetic screening.  If you are at high risk for breast cancer, talk to your health care provider about having an MRI and a mammogram every year.  Breast cancer gene (BRCA) assessment is recommended for women who have family members with BRCA-related cancers. BRCA-related cancers include:  Breast.  Ovarian.  Tubal.  Peritoneal cancers.  Results of the assessment will determine the need for genetic counseling and BRCA1 and BRCA2 testing. Cervical Cancer Your  health care provider may recommend that you be screened regularly for cancer of the pelvic organs (ovaries, uterus, and vagina). This screening involves a pelvic examination, including checking for microscopic changes to the surface of your cervix (Pap test). You may be encouraged to have this screening done every 3 years, beginning at age 51.  For women ages 44-65, health care providers may recommend pelvic exams and Pap testing every 3 years, or they may recommend the Pap and pelvic exam, combined with testing for human papilloma virus (HPV), every 5 years. Some types of HPV increase your risk of cervical cancer. Testing for HPV may also be done on women of any age with unclear Pap test results.  Other health care providers may not recommend any screening for nonpregnant women who are considered low risk for pelvic cancer and who do not have symptoms. Ask your health care provider if a screening pelvic exam is right for you.  If you have had past treatment for cervical cancer or a condition that could lead to cancer, you need Pap tests and screening for cancer for at least 20 years after your treatment. If Pap tests have been discontinued, your risk factors (such as having a new sexual partner) need to be reassessed to determine if screening should resume. Some women have medical problems that increase the chance of getting cervical cancer. In these cases, your health care provider may recommend more frequent screening and Pap tests. Colorectal Cancer  This type of cancer can be detected and often prevented.  Routine colorectal cancer screening usually begins at 40 years of age and continues through 40 years of age.  Your health care provider may recommend screening at an earlier age if you have risk factors for colon cancer.  Your health care provider may also recommend using home test kits to check for hidden blood in the stool.  A small camera at the end of a tube can be used to examine your  colon directly (sigmoidoscopy or colonoscopy). This is done to check for the earliest forms of colorectal cancer.  Routine screening usually begins at age 61.  Direct examination of the colon should be repeated every 5-10 years through 40 years of age. However, you may need to be screened more often if early forms of precancerous polyps or small growths are found. Skin Cancer  Check your skin from head to toe regularly.  Tell your health care provider about any new moles or changes in moles, especially if there is a change in a mole's shape or color.  Also tell your health care provider if you have a mole that is larger than the size of a pencil eraser.  Always use sunscreen. Apply sunscreen liberally and repeatedly throughout the day.  Protect yourself by wearing long sleeves, pants, a wide-brimmed hat, and sunglasses whenever you are outside. HEART DISEASE, DIABETES, AND HIGH BLOOD PRESSURE   High blood pressure causes heart disease and increases the risk of  stroke. High blood pressure is more likely to develop in:  People who have blood pressure in the high end of the normal range (130-139/85-89 mm Hg).  People who are overweight or obese.  People who are African American.  If you are 6-22 years of age, have your blood pressure checked every 3-5 years. If you are 22 years of age or older, have your blood pressure checked every year. You should have your blood pressure measured twice--once when you are at a hospital or clinic, and once when you are not at a hospital or clinic. Record the average of the two measurements. To check your blood pressure when you are not at a hospital or clinic, you can use:  An automated blood pressure machine at a pharmacy.  A home blood pressure monitor.  If you are between 30 years and 104 years old, ask your health care provider if you should take aspirin to prevent strokes.  Have regular diabetes screenings. This involves taking a blood sample to  check your fasting blood sugar level.  If you are at a normal weight and have a low risk for diabetes, have this test once every three years after 40 years of age.  If you are overweight and have a high risk for diabetes, consider being tested at a younger age or more often. PREVENTING INFECTION  Hepatitis B  If you have a higher risk for hepatitis B, you should be screened for this virus. You are considered at high risk for hepatitis B if:  You were born in a country where hepatitis B is common. Ask your health care provider which countries are considered high risk.  Your parents were born in a high-risk country, and you have not been immunized against hepatitis B (hepatitis B vaccine).  You have HIV or AIDS.  You use needles to inject street drugs.  You live with someone who has hepatitis B.  You have had sex with someone who has hepatitis B.  You get hemodialysis treatment.  You take certain medicines for conditions, including cancer, organ transplantation, and autoimmune conditions. Hepatitis C  Blood testing is recommended for:  Everyone born from [birth year] through 1965.  Anyone with known risk factors for hepatitis C. Sexually transmitted infections (STIs)  You should be screened for sexually transmitted infections (STIs) including gonorrhea and chlamydia if:  You are sexually active and are younger than 40 years of age.  You are older than 40 years of age and your health care provider tells you that you are at risk for this type of infection.  Your sexual activity has changed since you were last screened and you are at an increased risk for chlamydia or gonorrhea. Ask your health care provider if you are at risk.  If you do not have HIV, but are at risk, it may be recommended that you take a prescription medicine daily to prevent HIV infection. This is called pre-exposure prophylaxis (PrEP). You are considered at risk if:  You are sexually active and do not regularly use  condoms or know the HIV status of your partner(s).  You take drugs by injection.  You are sexually active with a partner who has HIV. Talk with your health care provider about whether you are at high risk of being infected with HIV. If you choose to begin PrEP, you should first be tested for HIV. You should then be tested every 3 months for as long as you are taking PrEP.  PREGNANCY   If  you are premenopausal and you may become pregnant, ask your health care provider about preconception counseling.  If you may become pregnant, take 400 to 800 micrograms (mcg) of folic acid every day.  If you want to prevent pregnancy, talk to your health care provider about birth control (contraception). OSTEOPOROSIS AND MENOPAUSE   Osteoporosis is a disease in which the bones lose minerals and strength with aging. This can result in serious bone fractures. Your risk for osteoporosis can be identified using a bone density scan.  If you are 48 years of age or older, or if you are at risk for osteoporosis and fractures, ask your health care provider if you should be screened.  Ask your health care provider whether you should take a calcium or vitamin D supplement to lower your risk for osteoporosis.  Menopause may have certain physical symptoms and risks.  Hormone replacement therapy may reduce some of these symptoms and risks. Talk to your health care provider about whether hormone replacement therapy is right for you.  HOME CARE INSTRUCTIONS   Schedule regular health, dental, and eye exams.  Stay current with your immunizations.   Do not use any tobacco products including cigarettes, chewing tobacco, or electronic cigarettes.  If you are pregnant, do not drink alcohol.  If you are breastfeeding, limit how much and how often you drink alcohol.  Limit alcohol intake to no more than 1 drink per day for nonpregnant women. One drink equals 12 ounces of beer, 5 ounces of wine, or 1 ounces of hard  liquor.  Do not use street drugs.  Do not share needles.  Ask your health care provider for help if you need support or information about quitting drugs.  Tell your health care provider if you often feel depressed.  Tell your health care provider if you have ever been abused or do not feel safe at home.   This information is not intended to replace advice given to you by your health care provider. Make sure you discuss any questions you have with your health care provider.   Document Released: 05/30/2011 Document Revised: 12/05/2014 Document Reviewed: 10/16/2013 Elsevier Interactive Patient Education [employer].

## 2016-08-09 ENCOUNTER — Encounter: Payer: Self-pay | Admitting: Family

## 2016-08-09 ENCOUNTER — Other Ambulatory Visit: Payer: Self-pay | Admitting: Family

## 2016-08-09 DIAGNOSIS — Z1239 Encounter for other screening for malignant neoplasm of breast: Secondary | ICD-10-CM

## 2016-08-29 ENCOUNTER — Other Ambulatory Visit: Payer: Self-pay | Admitting: Family

## 2016-08-29 ENCOUNTER — Ambulatory Visit
Admission: RE | Admit: 2016-08-29 | Discharge: 2016-08-29 | Disposition: A | Discharge status: Home / Self Care | Payer: 59 | Source: Ambulatory Visit | Attending: Family | Admitting: Family

## 2016-08-29 DIAGNOSIS — Z1231 Encounter for screening mammogram for malignant neoplasm of breast: Secondary | ICD-10-CM | POA: Insufficient documentation

## 2016-08-29 DIAGNOSIS — Z1239 Encounter for other screening for malignant neoplasm of breast: Secondary | ICD-10-CM

## 2016-09-30 ENCOUNTER — Encounter: Payer: Self-pay | Admitting: Family

## 2016-11-03 ENCOUNTER — Ambulatory Visit: Payer: Self-pay | Admitting: Physician Assistant

## 2016-11-03 ENCOUNTER — Encounter: Payer: Self-pay | Admitting: Physician Assistant

## 2016-11-03 VITALS — BP 114/80 | HR 76 | Temp 98.1°F

## 2016-11-03 DIAGNOSIS — R319 Hematuria, unspecified: Secondary | ICD-10-CM

## 2016-11-03 DIAGNOSIS — R300 Dysuria: Secondary | ICD-10-CM

## 2016-11-03 DIAGNOSIS — N390 Urinary tract infection, site not specified: Secondary | ICD-10-CM

## 2016-11-03 LAB — POCT URINALYSIS DIPSTICK
BILIRUBIN UA: NEGATIVE
Blood, UA: NEGATIVE
Glucose, UA: NEGATIVE
Ketones, UA: NEGATIVE
NITRITE UA: NEGATIVE
PH UA: 7
Spec Grav, UA: 1.02
Urobilinogen, UA: 0.2

## 2016-11-03 MED ORDER — CIPROFLOXACIN HCL 250 MG PO TABS: 250.0000 mg | ORAL_TABLET | Freq: Two times a day (BID) | ORAL | 0 refills | Status: DC

## 2016-11-03 NOTE — Progress Notes (Signed)
S:  C/o uti sx for 2 days, burning, urgency, frequency, denies vaginal discharge, abdominal pain or flank pain:  Remainder ros neg  O:  Vitals wnl, nad, ua with 2+ leuks  A: uti  P: cipro 250mg  bid x 7d, increase water intake, add cranberry juice, return if not improving in 2 -3 days, return earlier if worsening, discussed pyelonephritis sx

## 2017-01-11 DIAGNOSIS — Z6825 Body mass index (BMI) 25.0-25.9, adult: Secondary | ICD-10-CM | POA: Diagnosis not present

## 2017-01-11 DIAGNOSIS — Z01419 Encounter for gynecological examination (general) (routine) without abnormal findings: Secondary | ICD-10-CM | POA: Diagnosis not present

## 2017-01-11 LAB — HM PAP SMEAR: HM Pap smear: NEGATIVE

## 2017-01-19 ENCOUNTER — Telehealth: Payer: Self-pay | Admitting: *Deleted

## 2017-01-19 NOTE — Telephone Encounter (Signed)
Flu vaccine has been imputed into chart.

## 2017-01-19 NOTE — Telephone Encounter (Signed)
Voicemail: Pt stated that she received a message inquiring about her flu shot. She received her flu shot 08/11/16 at [facility]. Pt is a Furniture conservator/restorer

## 2017-02-08 ENCOUNTER — Encounter: Payer: Self-pay | Admitting: Family

## 2017-02-09 ENCOUNTER — Encounter: Payer: Self-pay | Admitting: Physician Assistant

## 2017-02-09 ENCOUNTER — Other Ambulatory Visit
Admission: RE | Admit: 2017-02-09 | Discharge: 2017-02-09 | Disposition: A | Discharge status: Home / Self Care | Payer: 59 | Source: Ambulatory Visit | Attending: Physician Assistant | Admitting: Physician Assistant

## 2017-02-09 ENCOUNTER — Ambulatory Visit: Payer: Self-pay | Admitting: Physician Assistant

## 2017-02-09 VITALS — BP 110/80 | HR 74 | Temp 97.9°F

## 2017-02-09 DIAGNOSIS — R300 Dysuria: Secondary | ICD-10-CM

## 2017-02-09 LAB — POCT URINALYSIS DIPSTICK
BILIRUBIN UA: NEGATIVE
Glucose, UA: NEGATIVE
Ketones, UA: NEGATIVE
NITRITE UA: NEGATIVE
PH UA: 8.5 — AB
RBC UA: NEGATIVE
Spec Grav, UA: 1.02
UROBILINOGEN UA: 0.2

## 2017-02-09 MED ORDER — PHENAZOPYRIDINE HCL 200 MG PO TABS: 200.0000 mg | ORAL_TABLET | Freq: Three times a day (TID) | ORAL | 0 refills | Status: DC | PRN

## 2017-02-09 MED ORDER — CIPROFLOXACIN HCL 500 MG PO TABS: 500.0000 mg | ORAL_TABLET | Freq: Two times a day (BID) | ORAL | 0 refills | Status: DC

## 2017-02-09 NOTE — Progress Notes (Signed)
   Subjective:UTI    Patient ID: Michaela Mitchell, female    DOB: May 19, 1976, 41 y.o.   MRN: [id number]  HPI Patient c/o urinary frequency, frequency, and dysuria for 3 days. Denies vaginal discharge, flank pain, or fever.   Review of Systems Negative except for compliant.    Objective:   Physical Exam Dip UA reveal 1+ leuc.       Assessment & Plan:UTI  Take Cipro and Pyridium as directed. Follow up  urine culture results.

## 2017-02-11 LAB — URINE CULTURE: Culture: 30000 — AB

## 2017-02-13 ENCOUNTER — Telehealth: Payer: Self-pay | Admitting: Physician Assistant

## 2017-05-12 ENCOUNTER — Ambulatory Visit: Payer: Self-pay | Admitting: Family

## 2017-05-12 VITALS — BP 120/80 | HR 78 | Temp 98.0°F

## 2017-05-12 DIAGNOSIS — N3000 Acute cystitis without hematuria: Secondary | ICD-10-CM

## 2017-05-12 LAB — POCT URINALYSIS DIPSTICK
Bilirubin, UA: NEGATIVE
Blood, UA: NEGATIVE
GLUCOSE UA: NEGATIVE
Ketones, UA: NEGATIVE
NITRITE UA: NEGATIVE
PROTEIN UA: NEGATIVE
Spec Grav, UA: 1.025 (ref 1.010–1.025)
UROBILINOGEN UA: 0.2 U/dL
pH, UA: 6.5 (ref 5.0–8.0)

## 2017-05-12 MED ORDER — CIPROFLOXACIN HCL 250 MG PO TABS: 250.0000 mg | ORAL_TABLET | Freq: Two times a day (BID) | ORAL | 0 refills | Status: DC

## 2017-05-12 NOTE — Progress Notes (Signed)
S/41 year old white married female with acute onset of urgency frequency of urination , urine malodorous. Denies fever, chills, abdominal pa, nausea, vomiting. She reports having 3 urinary tract infections in the past 2 years related to intercourse. She was recently cultured and grew out Escherichia coli sensitive to all antibiotics. She is on birth control pills and reports significant vaginal dryness not responding to OTC lubricants. She also reports inadequate intake of water  O/: Vital signs stable alert pleasant no acute distress abdomen slightly distended without tenderness UH with leukorrhea  A/: Cystitis P/: Cipro 250 twice a day 3 days. She is encouraged to increase her hydration . Discussed possibility of prophylactic Macrobid and she states she will follow-up with her gynecologist who mentioned prescribing her a hormone cream.

## 2017-05-12 NOTE — Addendum Note (Signed)
Addended by: Vassie Loll D on: 05/12/2017 03:22 PM   Modules accepted: Orders

## 2017-07-04 DIAGNOSIS — H52201 Unspecified astigmatism, right eye: Secondary | ICD-10-CM | POA: Diagnosis not present

## 2017-07-04 DIAGNOSIS — H5213 Myopia, bilateral: Secondary | ICD-10-CM | POA: Diagnosis not present

## 2017-07-20 ENCOUNTER — Other Ambulatory Visit: Payer: Self-pay | Admitting: Family

## 2017-07-24 ENCOUNTER — Other Ambulatory Visit: Payer: Self-pay | Admitting: Family

## 2017-07-24 DIAGNOSIS — Z1231 Encounter for screening mammogram for malignant neoplasm of breast: Secondary | ICD-10-CM

## 2017-08-17 ENCOUNTER — Encounter: Payer: Self-pay | Admitting: Family

## 2017-08-17 ENCOUNTER — Ambulatory Visit (INDEPENDENT_AMBULATORY_CARE_PROVIDER_SITE_OTHER): Payer: 59 | Admitting: Family

## 2017-08-17 VITALS — BP 122/82 | HR 67 | Temp 98.3°F | Ht 60.0 in | Wt 136.8 lb

## 2017-08-17 DIAGNOSIS — Z0000 Encounter for general adult medical examination without abnormal findings: Secondary | ICD-10-CM | POA: Diagnosis not present

## 2017-08-17 DIAGNOSIS — Z0001 Encounter for general adult medical examination with abnormal findings: Secondary | ICD-10-CM

## 2017-08-17 LAB — LIPID PANEL
Cholesterol: 188 mg/dL (ref 0–200)
HDL: 79.6 mg/dL (ref >39.00)
LDL Cholesterol: 94 mg/dL (ref 0–99)
NonHDL: 108.72
TRIGLYCERIDES: 73 mg/dL (ref 0.0–149.0)
Total CHOL/HDL Ratio: 2
VLDL: 14.6 mg/dL (ref 0.0–40.0)

## 2017-08-17 LAB — CBC WITH DIFFERENTIAL/PLATELET
BASOS ABS: 0.1 10*3/uL (ref 0.0–0.1)
BASOS PCT: 0.7 % (ref 0.0–3.0)
Eosinophils Absolute: 0 10*3/uL (ref 0.0–0.7)
Eosinophils Relative: 0.3 % (ref 0.0–5.0)
HEMATOCRIT: 42.9 % (ref 36.0–46.0)
Hemoglobin: 14.3 g/dL (ref 12.0–15.0)
LYMPHS PCT: 22.9 % (ref 12.0–46.0)
Lymphs Abs: 1.9 10*3/uL (ref 0.7–4.0)
MCHC: 33.3 g/dL (ref 30.0–36.0)
MCV: 91.5 fl (ref 78.0–100.0)
MONOS PCT: 6.7 % (ref 3.0–12.0)
Monocytes Absolute: 0.6 10*3/uL (ref 0.1–1.0)
Neutro Abs: 5.9 10*3/uL (ref 1.4–7.7)
Neutrophils Relative %: 69.4 % (ref 43.0–77.0)
Platelets: 232 10*3/uL (ref 150.0–400.0)
RBC: 4.69 Mil/uL (ref 3.87–5.11)
RDW: 13.1 % (ref 11.5–15.5)
WBC: 8.5 10*3/uL (ref 4.0–10.5)

## 2017-08-17 LAB — COMPREHENSIVE METABOLIC PANEL
ALBUMIN: 3.9 g/dL (ref 3.5–5.2)
ALT: 10 U/L (ref 0–35)
AST: 15 U/L (ref 0–37)
Alkaline Phosphatase: 56 U/L (ref 39–117)
BUN: 13 mg/dL (ref 6–23)
CALCIUM: 9 mg/dL (ref 8.4–10.5)
CHLORIDE: 106 meq/L (ref 96–112)
CO2: 25 meq/L (ref 19–32)
Creatinine, Ser: 0.82 mg/dL (ref 0.40–1.20)
GFR: 81.6 mL/min (ref >60.00)
Glucose, Bld: 84 mg/dL (ref 70–99)
POTASSIUM: 4 meq/L (ref 3.5–5.1)
Sodium: 137 mEq/L (ref 135–145)
Total Bilirubin: 0.5 mg/dL (ref 0.2–1.2)
Total Protein: 6.5 g/dL (ref 6.0–8.3)

## 2017-08-17 LAB — HEMOGLOBIN A1C: HEMOGLOBIN A1C: 4.8 % (ref 4.6–6.5)

## 2017-08-17 LAB — VITAMIN D 25 HYDROXY (VIT D DEFICIENCY, FRACTURES): VITD: 40.95 ng/mL (ref 30.00–100.00)

## 2017-08-17 LAB — TSH: TSH: 1.95 u[IU]/mL (ref 0.35–4.50)

## 2017-08-17 NOTE — Assessment & Plan Note (Signed)
Clinical breast exam performed today. Patient follows with OB/GYN for pelvic exam. In the absence of the complaints today, she declined pelvic exam. Advised tetanus vaccine a local pharmacy. Screening labs ordered. Encouraged regular exercise. Mammogram is scheduled. Advised patient to establish with dermatology in [place].  Patient verbalized that she would do so.

## 2017-08-17 NOTE — Progress Notes (Signed)
Subjective:    Patient ID: [name], female    DOB: [birth year], 41 y.o.   MRN: [id number]  CC: AVANNI TURNBAUGH is a 41 y.o. female who presents today for physical exam.    HPI: Feeling well. No complaints.   New job with [employer] in [place], excited about it.     Colorectal Cancer Screening: No early family history.  Breast Cancer Screening: Mammogram scheduled 08/2017 Cervical Cancer Screening: Follows with [facility] for pap/pelvic exam. Due 12/2017 per patient.  Bone Health screening/DEXA for 65+: No increased fracture risk. Defer screening at this time. Lung Cancer Screening: Doesn't have 30 year pack year history and age > 67 years.       Tetanus - due        Labs: Screening labs today. Exercise: Gets regular exercise.  Alcohol use: None Smoking/tobacco use: Nonsmoker.  Regular dental exams: UTD Wears seat belt: Yes. Skin : no new lesions; no h/o skin cancer. Prior derm in [place] whom she would like to re-establish with.   HISTORY:  History reviewed. No pertinent past medical history.  Past Surgical History:  Procedure Laterality Date  . WISDOM TOOTH EXTRACTION  2007   Family History  Problem Relation Age of Onset  . Alcohol abuse Father   . Heart disease Father        MI  . Arthritis Mother        rheumatoid arthritis  . Hypertension Mother   . Diabetes Mother   . COPD Mother       ALLERGIES: Sulfa antibiotics and Celebrex [celecoxib]  Current Outpatient Prescriptions on File Prior to Visit  Medication Sig Dispense Refill  . Ascorbic Acid (VITAMIN C) 100 MG tablet Take 100 mg by mouth daily.    . B Complex-C (SUPER B COMPLEX PO) Take 1 tablet by mouth daily.    . Multiple Vitamin (MULTIVITAMIN) tablet Take 1 tablet by mouth daily. Multi Vitamin One A Day Women's     No current facility-administered medications on file prior to visit.     Social History  Substance Use Topics  . Smoking status: Never Smoker  . Smokeless tobacco: Never  Used  . Alcohol use No    Review of Systems  Constitutional: Negative for chills, fever and unexpected weight change.  HENT: Negative for congestion.   Respiratory: Negative for cough.   Cardiovascular: Negative for chest pain, palpitations and leg swelling.  Gastrointestinal: Negative for nausea and vomiting.  Musculoskeletal: Negative for arthralgias and myalgias.  Skin: Negative for rash.  Neurological: Negative for headaches.  Hematological: Negative for adenopathy.  Psychiatric/Behavioral: Negative for confusion.      Objective:    BP 122/82   Pulse 67   Temp 98.3 F (36.8 C) (Oral)   Ht 5' (1.524 m)   Wt 136 lb 12.8 oz (62.1 kg)   SpO2 98%   BMI 26.72 kg/m   BP Readings from Last 3 Encounters:  08/17/17 122/82  05/12/17 120/80  02/09/17 110/80   Wt Readings from Last 3 Encounters:  08/17/17 136 lb 12.8 oz (62.1 kg)  08/08/16 128 lb 9.6 oz (58.3 kg)  11/11/15 120 lb (54.4 kg)    Physical Exam  Constitutional: She appears well-developed and well-nourished.  Eyes: Conjunctivae are normal.  Neck: No thyroid mass and no thyromegaly present.  Cardiovascular: Normal rate, regular rhythm, normal heart sounds and normal pulses.   Pulmonary/Chest: Effort normal and breath sounds normal. She has no wheezes. She has no  rhonchi. She has no rales. Right breast exhibits no inverted nipple, no mass, no nipple discharge, no skin change and no tenderness. Left breast exhibits no inverted nipple, no mass, no nipple discharge, no skin change and no tenderness. Breasts are symmetrical.  CBE performed.   Lymphadenopathy:       Head (right side): No submental, no submandibular, no tonsillar, no preauricular, no posterior auricular and no occipital adenopathy present.       Head (left side): No submental, no submandibular, no tonsillar, no preauricular, no posterior auricular and no occipital adenopathy present.    She has no cervical adenopathy.       Right cervical: No superficial  cervical, no deep cervical and no posterior cervical adenopathy present.      Left cervical: No superficial cervical, no deep cervical and no posterior cervical adenopathy present.    She has no axillary adenopathy.  Neurological: She is alert.  Skin: Skin is warm and dry.  Psychiatric: She has a normal mood and affect. Her speech is normal and behavior is normal. Thought content normal.  Vitals reviewed.      Assessment & Plan:   Problem List Items Addressed This Visit      Other   Encounter for routine adult physical exam with abnormal findings - Primary    Clinical breast exam performed today. Patient follows with OB/GYN for pelvic exam. In the absence of the complaints today, she declined pelvic exam. Advised tetanus vaccine a local pharmacy. Screening labs ordered. Encouraged regular exercise. Mammogram is scheduled. Advised patient to establish with dermatology in [place].  Patient verbalized that she would do so.      Relevant Orders   CBC with Differential/Platelet   Comprehensive metabolic panel   Hemoglobin A1c   Lipid panel   TSH   VITAMIN D 25 Hydroxy (Vit-D Deficiency, Fractures)       I have discontinued Ms. Gayle's OVER THE COUNTER MEDICATION, LARIN FE 1/20, phenazopyridine, and ciprofloxacin. I am also having her maintain her vitamin C, B Complex-C (SUPER B COMPLEX PO), multivitamin, and norethindrone-ethinyl estradiol.   Meds ordered this encounter  Medications  . norethindrone-ethinyl estradiol (MICROGESTIN,JUNEL,LOESTRIN) 1-20 MG-MCG tablet    Refill:  3    Return precautions given.   Risks, benefits, and alternatives of the medications and treatment plan prescribed today were discussed, and patient expressed understanding.   Education regarding symptom management and diagnosis given to patient on AVS.   Continue to follow with Burnard Hawthorne, FNP for routine health maintenance.   Jamie-Lee C Jamar and I agreed with plan.   Mable Paris, FNP

## 2017-08-17 NOTE — Patient Instructions (Signed)
Labs today    Health Maintenance, Female Adopting a healthy lifestyle and getting preventive care can go a long way to promote health and wellness. Talk with your health care provider about what schedule of regular examinations is right for you. This is a good chance for you to check in with your provider about disease prevention and staying healthy. In between checkups, there are plenty of things you can do on your own. Experts have done a lot of research about which lifestyle changes and preventive measures are most likely to keep you healthy. Ask your health care provider for more information. Weight and diet Eat a healthy diet  Be sure to include plenty of vegetables, fruits, low-fat dairy products, and lean protein.  Do not eat a lot of foods high in solid fats, added sugars, or salt.  Get regular exercise. This is one of the most important things you can do for your health. ? Most adults should exercise for at least 150 minutes each week. The exercise should increase your heart rate and make you sweat (moderate-intensity exercise). ? Most adults should also do strengthening exercises at least twice a week. This is in addition to the moderate-intensity exercise.  Maintain a healthy weight  Body mass index (BMI) is a measurement that can be used to identify possible weight problems. It estimates body fat based on height and weight. Your health care provider can help determine your BMI and help you achieve or maintain a healthy weight.  For females 20 years of age and older: ? A BMI below 18.5 is considered underweight. ? A BMI of 18.5 to 24.9 is normal. ? A BMI of 25 to 29.9 is considered overweight. ? A BMI of 30 and above is considered obese.  Watch levels of cholesterol and blood lipids  You should start having your blood tested for lipids and cholesterol at 41 years of age, then have this test every 5 years.  You may need to have your cholesterol levels checked more often  if: ? Your lipid or cholesterol levels are high. ? You are older than 41 years of age. ? You are at high risk for heart disease.  Cancer screening Lung Cancer  Lung cancer screening is recommended for adults 55-80 years old who are at high risk for lung cancer because of a history of smoking.  A yearly low-dose CT scan of the lungs is recommended for people who: ? Currently smoke. ? Have quit within the past 15 years. ? Have at least a 30-pack-year history of smoking. A pack year is smoking an average of one pack of cigarettes a day for 1 year.  Yearly screening should continue until it has been 15 years since you quit.  Yearly screening should stop if you develop a health problem that would prevent you from having lung cancer treatment.  Breast Cancer  Practice breast self-awareness. This means understanding how your breasts normally appear and feel.  It also means doing regular breast self-exams. Let your health care provider know about any changes, no matter how small.  If you are in your 20s or 30s, you should have a clinical breast exam (CBE) by a health care provider every 1-3 years as part of a regular health exam.  If you are 40 or older, have a CBE every year. Also consider having a breast X-ray (mammogram) every year.  If you have a family history of breast cancer, talk to your health care provider about genetic screening.  If   at high risk for breast cancer, talk to your health care provider about having an MRI and a mammogram every year.  Breast cancer gene (BRCA) assessment is recommended for women who have family members with BRCA-related cancers. BRCA-related cancers include: ? Breast. ? Ovarian. ? Tubal. ? Peritoneal cancers.  Results of the assessment will determine the need for genetic counseling and BRCA1 and BRCA2 testing.  Cervical Cancer Your health care provider may recommend that you be screened regularly for cancer of the pelvic organs  (ovaries, uterus, and vagina). This screening involves a pelvic examination, including checking for microscopic changes to the surface of your cervix (Pap test). You may be encouraged to have this screening done every 3 years, beginning at age 74.  For women ages 66-65, health care providers may recommend pelvic exams and Pap testing every 3 years, or they may recommend the Pap and pelvic exam, combined with testing for human papilloma virus (HPV), every 5 years. Some types of HPV increase your risk of cervical cancer. Testing for HPV may also be done on women of any age with unclear Pap test results.  Other health care providers may not recommend any screening for nonpregnant women who are considered low risk for pelvic cancer and who do not have symptoms. Ask your health care provider if a screening pelvic exam is right for you.  If you have had past treatment for cervical cancer or a condition that could lead to cancer, you need Pap tests and screening for cancer for at least 20 years after your treatment. If Pap tests have been discontinued, your risk factors (such as having a new sexual partner) need to be reassessed to determine if screening should resume. Some women have medical problems that increase the chance of getting cervical cancer. In these cases, your health care provider may recommend more frequent screening and Pap tests.  Colorectal Cancer  This type of cancer can be detected and often prevented.  Routine colorectal cancer screening usually begins at 41 years of age and continues through 41 years of age.  Your health care provider may recommend screening at an earlier age if you have risk factors for colon cancer.  Your health care provider may also recommend using home test kits to check for hidden blood in the stool.  A small camera at the end of a tube can be used to examine your colon directly (sigmoidoscopy or colonoscopy). This is done to check for the earliest forms of  colorectal cancer.  Routine screening usually begins at age 38.  Direct examination of the colon should be repeated every 5-10 years through 41 years of age. However, you may need to be screened more often if early forms of precancerous polyps or small growths are found.  Skin Cancer  Check your skin from head to toe regularly.  Tell your health care provider about any new moles or changes in moles, especially if there is a change in a mole's shape or color.  Also tell your health care provider if you have a mole that is larger than the size of a pencil eraser.  Always use sunscreen. Apply sunscreen liberally and repeatedly throughout the day.  Protect yourself by wearing long sleeves, pants, a wide-brimmed hat, and sunglasses whenever you are outside.  Heart disease, diabetes, and high blood pressure  High blood pressure causes heart disease and increases the risk of stroke. High blood pressure is more likely to develop in: ? People who have blood pressure in the  in the high end of the normal range (130-139/85-89 mm Hg). ? People who are overweight or obese. ? People who are African American.  If you are 18-39 years of age, have your blood pressure checked every 3-5 years. If you are 40 years of age or older, have your blood pressure checked every year. You should have your blood pressure measured twice-once when you are at a hospital or clinic, and once when you are not at a hospital or clinic. Record the average of the two measurements. To check your blood pressure when you are not at a hospital or clinic, you can use: ? An automated blood pressure machine at a pharmacy. ? A home blood pressure monitor.  If you are between 55 years and 79 years old, ask your health care provider if you should take aspirin to prevent strokes.  Have regular diabetes screenings. This involves taking a blood sample to check your fasting blood sugar level. ? If you are at a normal weight and have a low risk  for diabetes, have this test once every three years after 41 years of age. ? If you are overweight and have a high risk for diabetes, consider being tested at a younger age or more often. Preventing infection Hepatitis B  If you have a higher risk for hepatitis B, you should be screened for this virus. You are considered at high risk for hepatitis B if: ? You were born in a country where hepatitis B is common. Ask your health care provider which countries are considered high risk. ? Your parents were born in a high-risk country, and you have not been immunized against hepatitis B (hepatitis B vaccine). ? You have HIV or AIDS. ? You use needles to inject street drugs. ? You live with someone who has hepatitis B. ? You have had sex with someone who has hepatitis B. ? You get hemodialysis treatment. ? You take certain medicines for conditions, including cancer, organ transplantation, and autoimmune conditions.  Hepatitis C  Blood testing is recommended for: ? Everyone born from 1945 through 1965. ? Anyone with known risk factors for hepatitis C.  Sexually transmitted infections (STIs)  You should be screened for sexually transmitted infections (STIs) including gonorrhea and chlamydia if: ? You are sexually active and are younger than 41 years of age. ? You are older than 41 years of age and your health care provider tells you that you are at risk for this type of infection. ? Your sexual activity has changed since you were last screened and you are at an increased risk for chlamydia or gonorrhea. Ask your health care provider if you are at risk.  If you do not have HIV, but are at risk, it may be recommended that you take a prescription medicine daily to prevent HIV infection. This is called pre-exposure prophylaxis (PrEP). You are considered at risk if: ? You are sexually active and do not regularly use condoms or know the HIV status of your partner(s). ? You take drugs by  injection. ? You are sexually active with a partner who has HIV.  Talk with your health care provider about whether you are at high risk of being infected with HIV. If you choose to begin PrEP, you should first be tested for HIV. You should then be tested every 3 months for as long as you are taking PrEP. Pregnancy  If you are premenopausal and you may become pregnant, ask your health care provider about preconception counseling.    you may become pregnant, take 400 to 800 micrograms (mcg) of folic acid every day.  If you want to prevent pregnancy, talk to your health care provider about birth control (contraception). Osteoporosis and menopause  Osteoporosis is a disease in which the bones lose minerals and strength with aging. This can result in serious bone fractures. Your risk for osteoporosis can be identified using a bone density scan.  If you are 47 years of age or older, or if you are at risk for osteoporosis and fractures, ask your health care provider if you should be screened.  Ask your health care provider whether you should take a calcium or vitamin D supplement to lower your risk for osteoporosis.  Menopause may have certain physical symptoms and risks.  Hormone replacement therapy may reduce some of these symptoms and risks. Talk to your health care provider about whether hormone replacement therapy is right for you. Follow these instructions at home:  Schedule regular health, dental, and eye exams.  Stay current with your immunizations.  Do not use any tobacco products including cigarettes, chewing tobacco, or electronic cigarettes.  If you are pregnant, do not drink alcohol.  If you are breastfeeding, limit how much and how often you drink alcohol.  Limit alcohol intake to no more than 1 drink per day for nonpregnant women. One drink equals 12 ounces of beer, 5 ounces of wine, or 1 ounces of hard liquor.  Do not use street drugs.  Do not share needles.  Ask  your health care provider for help if you need support or information about quitting drugs.  Tell your health care provider if you often feel depressed.  Tell your health care provider if you have ever been abused or do not feel safe at home. This information is not intended to replace advice given to you by your health care provider. Make sure you discuss any questions you have with your health care provider. Document Released: 05/30/2011 Document Revised: 04/21/2016 Document Reviewed: 08/18/2015 Elsevier Interactive Patient Education  [employer].

## 2017-08-21 ENCOUNTER — Encounter: Payer: Self-pay | Admitting: Family

## 2017-08-30 ENCOUNTER — Ambulatory Visit
Admission: RE | Admit: 2017-08-30 | Discharge: 2017-08-30 | Disposition: A | Discharge status: Home / Self Care | Payer: 59 | Source: Ambulatory Visit | Attending: Family | Admitting: Family

## 2017-08-30 ENCOUNTER — Other Ambulatory Visit: Payer: Self-pay | Admitting: Family

## 2017-08-30 DIAGNOSIS — Z1231 Encounter for screening mammogram for malignant neoplasm of breast: Secondary | ICD-10-CM | POA: Insufficient documentation

## 2017-08-30 DIAGNOSIS — R928 Other abnormal and inconclusive findings on diagnostic imaging of breast: Secondary | ICD-10-CM

## 2017-08-31 ENCOUNTER — Ambulatory Visit
Admission: RE | Admit: 2017-08-31 | Discharge: 2017-08-31 | Disposition: A | Discharge status: Home / Self Care | Payer: 59 | Source: Ambulatory Visit | Attending: Family | Admitting: Family

## 2017-08-31 DIAGNOSIS — R928 Other abnormal and inconclusive findings on diagnostic imaging of breast: Secondary | ICD-10-CM | POA: Diagnosis not present

## 2017-08-31 DIAGNOSIS — R922 Inconclusive mammogram: Secondary | ICD-10-CM | POA: Diagnosis not present

## 2017-10-10 MED FILL — LARIN 21 1-20 TABLET: 1-20 | 84 days supply | Qty: 84 | Fill #0

## 2018-01-09 ENCOUNTER — Telehealth: Payer: Self-pay | Admitting: Family

## 2018-01-09 NOTE — Telephone Encounter (Signed)
Copied from [place]. Topic: Appointment Scheduling - Prior Auth Required for Appointment >> Jan 09, 2018  2:57 PM Vernona Rieger wrote: Patient is requesting to transfer her care from [place] to [facility] @ [address] office. [phone]

## 2018-01-09 NOTE — Telephone Encounter (Signed)
Okay 

## 2018-01-10 NOTE — Telephone Encounter (Signed)
Okay with me 

## 2018-01-17 DIAGNOSIS — Z01419 Encounter for gynecological examination (general) (routine) without abnormal findings: Secondary | ICD-10-CM | POA: Diagnosis not present

## 2018-01-17 DIAGNOSIS — Z6828 Body mass index (BMI) 28.0-28.9, adult: Secondary | ICD-10-CM | POA: Diagnosis not present

## 2018-03-06 MED FILL — LO LOESTRIN FE 1-10 TABLET: 1 MG-10 MCG | 24 days supply | Qty: 28 | Fill #0

## 2018-04-02 MED FILL — LO LOESTRIN FE 1-10 TABLET: 1 MG-10 MCG | 24 days supply | Qty: 28 | Fill #1

## 2018-04-27 MED FILL — LO LOESTRIN FE 1-10 TABLET: 1 MG-10 MCG | 24 days supply | Qty: 28 | Fill #2

## 2018-05-17 MED FILL — LO LOESTRIN FE 1-10 TABLET: 1 MG-10 MCG | 24 days supply | Qty: 28 | Fill #3

## 2018-06-12 MED FILL — LO LOESTRIN FE 1-10 TABLET: 1 MG-10 MCG | 24 days supply | Qty: 28 | Fill #4

## 2018-06-29 ENCOUNTER — Ambulatory Visit: Payer: Self-pay | Admitting: Nurse Practitioner

## 2018-07-04 MED FILL — LO LOESTRIN FE 1-10 TABLET: 1 MG-10 MCG | 72 days supply | Qty: 84 | Fill #5

## 2018-07-19 ENCOUNTER — Ambulatory Visit: Payer: Self-pay | Admitting: Nurse Practitioner

## 2018-07-19 ENCOUNTER — Ambulatory Visit (INDEPENDENT_AMBULATORY_CARE_PROVIDER_SITE_OTHER): Payer: 59 | Admitting: Nurse Practitioner

## 2018-07-19 ENCOUNTER — Encounter: Payer: Self-pay | Admitting: Nurse Practitioner

## 2018-07-19 VITALS — BP 102/80 | HR 74 | Ht 59.0 in | Wt 133.0 lb

## 2018-07-19 DIAGNOSIS — Z0000 Encounter for general adult medical examination without abnormal findings: Secondary | ICD-10-CM

## 2018-07-19 DIAGNOSIS — Z7689 Persons encountering health services in other specified circumstances: Secondary | ICD-10-CM | POA: Diagnosis not present

## 2018-07-19 DIAGNOSIS — Z1239 Encounter for other screening for malignant neoplasm of breast: Secondary | ICD-10-CM

## 2018-07-19 NOTE — Progress Notes (Addendum)
Name: Michaela Mitchell   MRN: [id number]    DOB: 19-Mar-1976   Date:07/19/2018       Progress Note  Subjective  Chief Complaint  No chief complaint on file.   HPI  Michaela Mitchell is here today to establish care as a transfer from another [facility] practice due to location, has recently moved from [place] to [place] Aside from primary care, she routinely follows with OBGYN for routine womens care and birth control management. She is not maintained on any other daily medications besides birth control. She feels well today with no complaints and just wants to establish care, she would like to return next month to see me for her annual physical. She is requesting referral to [facility] for annual mammogram.  Patient Active Problem List   Diagnosis Date Noted  . 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 Migraine 08/08/2016  . Patellofemoral pain syndrome 11/11/2015  . Encounter for routine adult physical exam with abnormal findings 07/21/2015  . Encounter to establish care 02/23/2015  . Tendinitis 02/23/2015  . Acne 09/06/2013  . Knee pain 03/28/2012  . Foot pain 03/28/2012    Past Surgical History:  Procedure Laterality Date  . WISDOM TOOTH EXTRACTION  2007    Family History  Problem Relation Age of Onset  . Alcohol abuse Father   . Heart disease Father        MI  . Arthritis Mother        rheumatoid arthritis  . Hypertension Mother   . Diabetes Mother   . COPD Mother   . Breast cancer Neg Hx     Social History   Socioeconomic History  . Marital status: Married    Spouse name: Not on file  . Number of children: Not on file  . Years of education: Not on file  . Highest education level: Not on file  Occupational History  . Not on file  Social Needs  . Financial resource strain: Not on file  . Food insecurity:    Worry: Not on file    Inability: Not on file  . Transportation needs:    Medical: Not on file    Non-medical: Not on file  Tobacco Use  . Smoking status: Never  Smoker  . Smokeless tobacco: Never Used  Substance and Sexual Activity  . Alcohol use: No    Alcohol/week: 0.0 standard drinks  . Drug use: No  . Sexual activity: Not on file  Lifestyle  . Physical activity:    Days per week: Not on file    Minutes per session: Not on file  . Stress: Not on file  Relationships  . Social connections:    Talks on phone: Not on file    Gets together: Not on file    Attends religious service: Not on file    Active member of club or organization: Not on file    Attends meetings of clubs or organizations: Not on file    Relationship status: Not on file  . Intimate partner violence:    Fear of current or ex partner: Not on file    Emotionally abused: Not on file    Physically abused: Not on file    Forced sexual activity: Not on file  Other Topics Concern  . Not on file  Social History Narrative   At [employer] [facility] [employer] in [place].    Lives in [place] and commute 35 minutes.    Husband and dog.    Exercise- work out videos, zumba,  runs intermittently; doesn't eat cheese as causes migraines.    Diet-eats lots of fruits and vegetables     Current Outpatient Medications:  .  Ascorbic Acid (VITAMIN C) 100 MG tablet, Take 100 mg by mouth daily., Disp: , Rfl:  .  B Complex-C (SUPER B COMPLEX PO), Take 1 tablet by mouth daily., Disp: , Rfl:  .  Biotin 5000 MCG CAPS, daily., Disp: , Rfl:  .  Multiple Vitamin (MULTIVITAMIN) tablet, Take 1 tablet by mouth daily. Multi Vitamin One A Day Women's, Disp: , Rfl:  .  norethindrone-ethinyl estradiol (MICROGESTIN,JUNEL,LOESTRIN) 1-20 MG-MCG tablet, , Disp: , Rfl: 3 .  Probiotic Product (PROBIOTIC-10 ULTIMATE) CAPS, Take 2 capsules by mouth daily., Disp: , Rfl:   Allergies  Allergen Reactions  . Sulfa Antibiotics     Just Celebrex  . Celebrex [Celecoxib] Rash     Review of Systems  Constitutional: Negative for chills and fever.  HENT: Negative for hearing loss.   Eyes: Negative for blurred vision and double vision.   Respiratory: Negative for shortness of breath.   Cardiovascular: Negative for chest pain.  Gastrointestinal: Negative for diarrhea, nausea and vomiting.  Genitourinary: Negative for dysuria, frequency and urgency.  Musculoskeletal: Negative for falls.  Skin: Negative for rash.  Neurological: Negative for dizziness and loss of consciousness.  Endo/Heme/Allergies: Does not bruise/bleed easily.  Psychiatric/Behavioral: Negative for depression. The patient is not nervous/anxious.    Objective  Vitals:   07/19/18 1452  BP: 102/80  Pulse: 74  SpO2: 98%  Weight: 133 lb (60.3 kg)  Height: 4\' 11"  (1.499 m)    Body mass index is 26.86 kg/m.  Physical Exam Vital signs reviewed. Constitutional: Patient appears well-developed and well-nourished. No distress.  HENT: Head: Normocephalic and atraumatic.   Nose: Nose normal. Mouth/Throat: Oropharynx is clear and moist. No oropharyngeal exudate.  Eyes: Conjunctivae and EOM are normal. Pupils are equal, round, and reactive to light. No scleral icterus.  Neck: Normal range of motion. Neck supple. No thyromegaly present.  Cardiovascular: Normal rate, regular rhythm and normal heart sounds.  No murmur heard. No BLE edema. Distal pulses intact. Pulmonary/Chest: Effort normal and breath sounds normal. No respiratory distress. Musculoskeletal: Normal range of motion. No gross deformities Neurological: She is alert and oriented to person, place, and time. No cranial nerve deficit. Coordination, balance, strength, speech and gait are normal.  Skin: Skin is warm and dry. No rash noted. No erythema.  Psychiatric: Patient has a normal mood and affect. behavior is normal. Judgment and thought content normal.   Assessment & Plan RTC for CPE after 08/17/18  Healthcare maintenance - MM DIGITAL SCREENING BILATERAL; Future-Screening for breast cancer  Encounter to establish care ROS and PE normal today She will return next month for her annual physical

## 2018-07-19 NOTE — Patient Instructions (Addendum)
Please return after 9/20 for annual physical with fasting lab work.  It was nice to meet you today!

## 2018-07-20 ENCOUNTER — Other Ambulatory Visit: Payer: Self-pay | Admitting: Nurse Practitioner

## 2018-07-20 DIAGNOSIS — Z1231 Encounter for screening mammogram for malignant neoplasm of breast: Secondary | ICD-10-CM

## 2018-08-13 DIAGNOSIS — H5213 Myopia, bilateral: Secondary | ICD-10-CM | POA: Diagnosis not present

## 2018-08-13 DIAGNOSIS — H52201 Unspecified astigmatism, right eye: Secondary | ICD-10-CM | POA: Diagnosis not present

## 2018-08-23 ENCOUNTER — Encounter: Payer: Self-pay | Admitting: Nurse Practitioner

## 2018-08-30 MED FILL — LO LOESTRIN FE 1-10 TABLET: 1 MG-10 MCG | 72 days supply | Qty: 84 | Fill #6

## 2018-08-31 ENCOUNTER — Ambulatory Visit
Admission: RE | Admit: 2018-08-31 | Discharge: 2018-08-31 | Disposition: A | Discharge status: Home / Self Care | Payer: 59 | Source: Ambulatory Visit | Attending: Nurse Practitioner | Admitting: Nurse Practitioner

## 2018-08-31 DIAGNOSIS — Z1231 Encounter for screening mammogram for malignant neoplasm of breast: Secondary | ICD-10-CM

## 2018-09-03 ENCOUNTER — Encounter: Payer: Self-pay | Admitting: Nurse Practitioner

## 2018-09-28 ENCOUNTER — Encounter: Payer: Self-pay | Admitting: Nurse Practitioner

## 2018-09-28 ENCOUNTER — Other Ambulatory Visit (INDEPENDENT_AMBULATORY_CARE_PROVIDER_SITE_OTHER): Payer: 59

## 2018-09-28 ENCOUNTER — Ambulatory Visit (INDEPENDENT_AMBULATORY_CARE_PROVIDER_SITE_OTHER): Payer: 59 | Admitting: Nurse Practitioner

## 2018-09-28 VITALS — BP 100/80 | HR 59 | Ht 59.0 in | Wt 133.0 lb

## 2018-09-28 DIAGNOSIS — Z13220 Encounter for screening for lipoid disorders: Secondary | ICD-10-CM

## 2018-09-28 DIAGNOSIS — Z0001 Encounter for general adult medical examination with abnormal findings: Secondary | ICD-10-CM

## 2018-09-28 DIAGNOSIS — Z0000 Encounter for general adult medical examination without abnormal findings: Secondary | ICD-10-CM | POA: Diagnosis not present

## 2018-09-28 LAB — CBC
HEMATOCRIT: 44.5 % (ref 36.0–46.0)
HEMOGLOBIN: 15.4 g/dL — AB (ref 12.0–15.0)
MCHC: 34.5 g/dL (ref 30.0–36.0)
MCV: 90.2 fl (ref 78.0–100.0)
Platelets: 214 10*3/uL (ref 150.0–400.0)
RBC: 4.94 Mil/uL (ref 3.87–5.11)
RDW: 13.1 % (ref 11.5–15.5)
WBC: 7.3 10*3/uL (ref 4.0–10.5)

## 2018-09-28 LAB — COMPREHENSIVE METABOLIC PANEL
ALK PHOS: 67 U/L (ref 39–117)
ALT: 12 U/L (ref 0–35)
AST: 15 U/L (ref 0–37)
Albumin: 4.2 g/dL (ref 3.5–5.2)
BUN: 12 mg/dL (ref 6–23)
CO2: 25 mEq/L (ref 19–32)
Calcium: 10 mg/dL (ref 8.4–10.5)
Chloride: 105 mEq/L (ref 96–112)
Creatinine, Ser: 0.9 mg/dL (ref 0.40–1.20)
GFR: 72.89 mL/min (ref >60.00)
Glucose, Bld: 84 mg/dL (ref 70–99)
POTASSIUM: 3.9 meq/L (ref 3.5–5.1)
SODIUM: 139 meq/L (ref 135–145)
TOTAL PROTEIN: 7.2 g/dL (ref 6.0–8.3)
Total Bilirubin: 0.5 mg/dL (ref 0.2–1.2)

## 2018-09-28 LAB — LIPID PANEL
Cholesterol: 169 mg/dL (ref 0–200)
HDL: 63.1 mg/dL (ref >39.00)
LDL Cholesterol: 89 mg/dL (ref 0–99)
NonHDL: 106.38
Total CHOL/HDL Ratio: 3
Triglycerides: 85 mg/dL (ref 0.0–149.0)
VLDL: 17 mg/dL (ref 0.0–40.0)

## 2018-09-28 NOTE — Progress Notes (Signed)
Michaela Mitchell is a 42 y.o. female with the following history as recorded in Epic[facility]:  Patient Active Problem List   Diagnosis Date Noted  . 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 Migraine 08/08/2016  . Patellofemoral pain syndrome 11/11/2015  . Encounter for routine adult physical exam with abnormal findings 07/21/2015  . Encounter to establish care 02/23/2015  . Tendinitis 02/23/2015  . Acne 09/06/2013  . Knee pain 03/28/2012  . Foot pain 03/28/2012    Current Outpatient Medications  Medication Sig Dispense Refill  . Ascorbic Acid (VITAMIN C) 100 MG tablet Take 100 mg by mouth daily.    . B Complex-C (SUPER B COMPLEX PO) Take 1 tablet by mouth daily.    . Biotin 5000 MCG CAPS daily.    . LO LOESTRIN FE 1 MG-10 MCG / 10 MCG tablet Take 1 tablet by mouth continuous.  12  . Multiple Vitamin (MULTIVITAMIN) tablet Take 1 tablet by mouth daily. Multi Vitamin One A Day Women's     No current facility-administered medications for this visit.     Allergies: Sulfa antibiotics and Celebrex [celecoxib]  History reviewed. No pertinent past medical history.  Past Surgical History:  Procedure Laterality Date  . WISDOM TOOTH EXTRACTION  2007    Family History  Problem Relation Age of Onset  . Alcohol abuse Father   . Heart disease Father        MI  . Arthritis Mother        rheumatoid arthritis  . Hypertension Mother   . Diabetes Mother   . COPD Mother   . Breast cancer Neg Hx     Social History   Tobacco Use  . Smoking status: Never Smoker  . Smokeless tobacco: Never Used  Substance Use Topics  . Alcohol use: No    Alcohol/week: 0.0 standard drinks     Subjective:  Here today for CPE.  CPE-  Last dental exam: sees dentist bi- annually, last in 2019 Last vision exam: annually, Last in  2019 PAP: follows with GYN for regular womens care, PAP records requested  Mammogram: up to date 08/2018 Lipids: lipid panel today Vaccinations: up to date Diet and exercise: would like to  lose 15 lbs, runs 2-3 times per week, walks daily about 30 minutes with dog, just started walking at lunch,thinking of counting calories  Review of Systems  Constitutional: Negative for chills and fever.  HENT: Negative for hearing loss and sore throat.   Eyes: Negative for blurred vision and double vision.  Respiratory: Negative for cough and shortness of breath.   Cardiovascular: Negative for chest pain and palpitations.  Gastrointestinal: Negative for constipation, diarrhea, nausea and vomiting.  Genitourinary: Negative for dysuria and hematuria.  Musculoskeletal: Negative for falls.  Skin: Negative for rash.  Neurological: Negative for speech change, loss of consciousness and headaches.  Endo/Heme/Allergies: Does not bruise/bleed easily.  Psychiatric/Behavioral: Negative for depression. The patient is not nervous/anxious.     Objective:  Vitals:   09/28/18 0825  BP: 100/80  Pulse: (!) 59  SpO2: 95%  Weight: 133 lb (60.3 kg)  Height: 4\' 11"  (1.499 m)   Body mass index is 26.86 kg/m.    General: Well developed, well nourished, in no acute distress  Skin : Warm and dry.  Head: Normocephalic and atraumatic  Eyes: Sclera and conjunctiva clear; pupils round and reactive to light; extraocular movements intact  Ears: External normal; canals clear; tympanic membranes normal  Oropharynx: Pink, supple. No suspicious lesions  Neck: Supple  without thyromegaly, adenopathy  Lungs: Respirations unlabored; clear to auscultation bilaterally CVS exam: normal rate, regular rhythm, normal S1, S2, no murmurs Abdomen: Soft; nontender; nondistended; normoactive bowel sounds; no masses or hepatosplenomegaly  Musculoskeletal: No deformities; no active joint inflammation  Extremities: No edema, cyanosis Vessels: Symmetric bilaterally  Neurologic: Alert and oriented; speech intact; face symmetrical; moves all extremities well; CNII-XII intact without focal deficit    Assessment:  1. Encounter  for routine adult physical exam with abnormal findings   2. Screening for cholesterol level     Plan:  Update annual labs, she is fasting today Reviewed annual screening exams, healthy lifestyle, weight loss, additional information provided on AVS  Return in about 1 year (around 09/29/2019) for CPE.  Orders Placed This Encounter  Procedures  . CBC    Standing Status:   Future    Standing Expiration Date:   09/29/2019  . Comprehensive metabolic panel    Standing Status:   Future    Standing Expiration Date:   09/29/2019  . Lipid panel    Standing Status:   Future    Standing Expiration Date:   09/29/2019    Requested Prescriptions    No prescriptions requested or ordered in this encounter

## 2018-09-28 NOTE — Patient Instructions (Addendum)
Lab work downstairs.  It was good to see you today!  Health Maintenance, Female Adopting a healthy lifestyle and getting preventive care can go a long way to promote health and wellness. Talk with your health care provider about what schedule of regular examinations is right for you. This is a good chance for you to check in with your provider about disease prevention and staying healthy. In between checkups, there are plenty of things you can do on your own. Experts have done a lot of research about which lifestyle changes and preventive measures are most likely to keep you healthy. Ask your health care provider for more information. Weight and diet Eat a healthy diet  Be sure to include plenty of vegetables, fruits, low-fat dairy products, and lean protein.  Do not eat a lot of foods high in solid fats, added sugars, or salt.  Get regular exercise. This is one of the most important things you can do for your health. ? Most adults should exercise for at least 150 minutes each week. The exercise should increase your heart rate and make you sweat (moderate-intensity exercise). ? Most adults should also do strengthening exercises at least twice a week. This is in addition to the moderate-intensity exercise.  Maintain a healthy weight  Body mass index (BMI) is a measurement that can be used to identify possible weight problems. It estimates body fat based on height and weight. Your health care provider can help determine your BMI and help you achieve or maintain a healthy weight.  For females 70 years of age and older: ? A BMI below 18.5 is considered underweight. ? A BMI of 18.5 to 24.9 is normal. ? A BMI of 25 to 29.9 is considered overweight. ? A BMI of 30 and above is considered obese.  Watch levels of cholesterol and blood lipids  You should start having your blood tested for lipids and cholesterol at 42 years of age, then have this test every 5 years.  You may need to have your  cholesterol levels checked more often if: ? Your lipid or cholesterol levels are high. ? You are older than 42 years of age. ? You are at high risk for heart disease.  Cancer screening Lung Cancer  Lung cancer screening is recommended for adults 63-85 years old who are at high risk for lung cancer because of a history of smoking.  A yearly low-dose CT scan of the lungs is recommended for people who: ? Currently smoke. ? Have quit within the past 15 years. ? Have at least a 30-pack-year history of smoking. A pack year is smoking an average of one pack of cigarettes a day for 1 year.  Yearly screening should continue until it has been 15 years since you quit.  Yearly screening should stop if you develop a health problem that would prevent you from having lung cancer treatment.  Breast Cancer  Practice breast self-awareness. This means understanding how your breasts normally appear and feel.  It also means doing regular breast self-exams. Let your health care provider know about any changes, no matter how small.  If you are in your 20s or 30s, you should have a clinical breast exam (CBE) by a health care provider every 1-3 years as part of a regular health exam.  If you are 72 or older, have a CBE every year. Also consider having a breast X-ray (mammogram) every year.  If you have a family history of breast cancer, talk to your health  provider about genetic screening.  If you are at high risk for breast cancer, talk to your health care provider about having an MRI and a mammogram every year.  Breast cancer gene (BRCA) assessment is recommended for women who have family members with BRCA-related cancers. BRCA-related cancers include: ? Breast. ? Ovarian. ? Tubal. ? Peritoneal cancers.  Results of the assessment will determine the need for genetic counseling and BRCA1 and BRCA2 testing.  Cervical Cancer Your health care provider may recommend that you be screened regularly for cancer of  the pelvic organs (ovaries, uterus, and vagina). This screening involves a pelvic examination, including checking for microscopic changes to the surface of your cervix (Pap test). You may be encouraged to have this screening done every 3 years, beginning at age 21.  For women ages 30-65, health care providers may recommend pelvic exams and Pap testing every 3 years, or they may recommend the Pap and pelvic exam, combined with testing for human papilloma virus (HPV), every 5 years. Some types of HPV increase your risk of cervical cancer. Testing for HPV may also be done on women of any age with unclear Pap test results.  Other health care providers may not recommend any screening for nonpregnant women who are considered low risk for pelvic cancer and who do not have symptoms. Ask your health care provider if a screening pelvic exam is right for you.  If you have had past treatment for cervical cancer or a condition that could lead to cancer, you need Pap tests and screening for cancer for at least 20 years after your treatment. If Pap tests have been discontinued, your risk factors (such as having a new sexual partner) need to be reassessed to determine if screening should resume. Some women have medical problems that increase the chance of getting cervical cancer. In these cases, your health care provider may recommend more frequent screening and Pap tests.  Colorectal Cancer  This type of cancer can be detected and often prevented.  Routine colorectal cancer screening usually begins at 42 years of age and continues through 42 years of age.  Your health care provider may recommend screening at an earlier age if you have risk factors for colon cancer.  Your health care provider may also recommend using home test kits to check for hidden blood in the stool.  A small camera at the end of a tube can be used to examine your colon directly (sigmoidoscopy or colonoscopy). This is done to check for the  earliest forms of colorectal cancer.  Routine screening usually begins at age 50.  Direct examination of the colon should be repeated every 5-10 years through 42 years of age. However, you may need to be screened more often if early forms of precancerous polyps or small growths are found.  Skin Cancer  Check your skin from head to toe regularly.  Tell your health care provider about any new moles or changes in moles, especially if there is a change in a mole's shape or color.  Also tell your health care provider if you have a mole that is larger than the size of a pencil eraser.  Always use sunscreen. Apply sunscreen liberally and repeatedly throughout the day.  Protect yourself by wearing long sleeves, pants, a wide-brimmed hat, and sunglasses whenever you are outside.  Heart disease, diabetes, and high blood pressure  High blood pressure causes heart disease and increases the risk of stroke. High blood pressure is more likely to develop in: ?   People who have blood pressure in the high end of the normal range (130-139/85-89 mm Hg). ? People who are overweight or obese. ? People who are African American.  If you are 18-39 years of age, have your blood pressure checked every 3-5 years. If you are 40 years of age or older, have your blood pressure checked every year. You should have your blood pressure measured twice-once when you are at a hospital or clinic, and once when you are not at a hospital or clinic. Record the average of the two measurements. To check your blood pressure when you are not at a hospital or clinic, you can use: ? An automated blood pressure machine at a pharmacy. ? A home blood pressure monitor.  If you are between 55 years and 79 years old, ask your health care provider if you should take aspirin to prevent strokes.  Have regular diabetes screenings. This involves taking a blood sample to check your fasting blood sugar level. ? If you are at a normal weight and  have a low risk for diabetes, have this test once every three years after 42 years of age. ? If you are overweight and have a high risk for diabetes, consider being tested at a younger age or more often. Preventing infection Hepatitis B  If you have a higher risk for hepatitis B, you should be screened for this virus. You are considered at high risk for hepatitis B if: ? You were born in a country where hepatitis B is common. Ask your health care provider which countries are considered high risk. ? Your parents were born in a high-risk country, and you have not been immunized against hepatitis B (hepatitis B vaccine). ? You have HIV or AIDS. ? You use needles to inject street drugs. ? You live with someone who has hepatitis B. ? You have had sex with someone who has hepatitis B. ? You get hemodialysis treatment. ? You take certain medicines for conditions, including cancer, organ transplantation, and autoimmune conditions.  Hepatitis C  Blood testing is recommended for: ? Everyone born from 1945 through 1965. ? Anyone with known risk factors for hepatitis C.  Sexually transmitted infections (STIs)  You should be screened for sexually transmitted infections (STIs) including gonorrhea and chlamydia if: ? You are sexually active and are younger than 42 years of age. ? You are older than 42 years of age and your health care provider tells you that you are at risk for this type of infection. ? Your sexual activity has changed since you were last screened and you are at an increased risk for chlamydia or gonorrhea. Ask your health care provider if you are at risk.  If you do not have HIV, but are at risk, it may be recommended that you take a prescription medicine daily to prevent HIV infection. This is called pre-exposure prophylaxis (PrEP). You are considered at risk if: ? You are sexually active and do not regularly use condoms or know the HIV status of your partner(s). ? You take drugs by  injection. ? You are sexually active with a partner who has HIV.  Talk with your health care provider about whether you are at high risk of being infected with HIV. If you choose to begin PrEP, you should first be tested for HIV. You should then be tested every 3 months for as long as you are taking PrEP. Pregnancy  If you are premenopausal and you may become pregnant, ask your health   your health care provider about preconception counseling.  If you may become pregnant, take 400 to 800 micrograms (mcg) of folic acid every day.  If you want to prevent pregnancy, talk to your health care provider about birth control (contraception). Osteoporosis and menopause  Osteoporosis is a disease in which the bones lose minerals and strength with aging. This can result in serious bone fractures. Your risk for osteoporosis can be identified using a bone density scan.  If you are 13 years of age or older, or if you are at risk for osteoporosis and fractures, ask your health care provider if you should be screened.  Ask your health care provider whether you should take a calcium or vitamin D supplement to lower your risk for osteoporosis.  Menopause may have certain physical symptoms and risks.  Hormone replacement therapy may reduce some of these symptoms and risks. Talk to your health care provider about whether hormone replacement therapy is right for you. Follow these instructions at home:  Schedule regular health, dental, and eye exams.  Stay current with your immunizations.  Do not use any tobacco products including cigarettes, chewing tobacco, or electronic cigarettes.  If you are pregnant, do not drink alcohol.  If you are breastfeeding, limit how much and how often you drink alcohol.  Limit alcohol intake to no more than 1 drink per day for nonpregnant women. One drink equals 12 ounces of beer, 5 ounces of wine, or 1 ounces of hard liquor.  Do not use street  drugs.  Do not share needles.  Ask your health care provider for help if you need support or information about quitting drugs.  Tell your health care provider if you often feel depressed.  Tell your health care provider if you have ever been abused or do not feel safe at home. This information is not intended to replace advice given to you by your health care provider. Make sure you discuss any questions you have with your health care provider. Document Released: 05/30/2011 Document Revised: 04/21/2016 Document Reviewed: 08/18/2015 Elsevier Interactive Patient Education  [employer].

## 2018-10-06 LAB — GLUCOSE, POCT (MANUAL RESULT ENTRY): POC GLUCOSE: 80 mg/dL (ref 70–99)

## 2018-10-10 ENCOUNTER — Encounter: Payer: Self-pay | Admitting: Nurse Practitioner

## 2018-11-26 MED FILL — LO LOESTRIN FE 1-10 TABLET: 1 MG-10 MCG | 48 days supply | Qty: 56 | Fill #7

## 2019-01-14 MED FILL — LO LOESTRIN FE 1-10 TABLET: 1 MG-10 MCG | 24 days supply | Qty: 28 | Fill #0

## 2019-01-23 DIAGNOSIS — Z01419 Encounter for gynecological examination (general) (routine) without abnormal findings: Secondary | ICD-10-CM | POA: Diagnosis not present

## 2019-01-23 DIAGNOSIS — Z6827 Body mass index (BMI) 27.0-27.9, adult: Secondary | ICD-10-CM | POA: Diagnosis not present

## 2019-02-05 MED FILL — LO LOESTRIN FE 1-10 TABLET: 1 MG-10 MCG | 84 days supply | Qty: 112 | Fill #0 | Status: TO

## 2019-04-30 MED FILL — LO LOESTRIN FE 1-10 TABLET: 1 MG-10 MCG | 84 days supply | Qty: 112 | Fill #0

## 2019-07-04 ENCOUNTER — Encounter (HOSPITAL_COMMUNITY): Payer: Self-pay

## 2019-07-04 ENCOUNTER — Ambulatory Visit (INDEPENDENT_AMBULATORY_CARE_PROVIDER_SITE_OTHER)
Admission: RE | Admit: 2019-07-04 | Discharge: 2019-07-04 | Disposition: A | Discharge status: Home / Self Care | Payer: 59 | Source: Ambulatory Visit

## 2019-07-04 DIAGNOSIS — R3915 Urgency of urination: Secondary | ICD-10-CM

## 2019-07-04 DIAGNOSIS — R350 Frequency of micturition: Secondary | ICD-10-CM

## 2019-07-04 DIAGNOSIS — R339 Retention of urine, unspecified: Secondary | ICD-10-CM | POA: Diagnosis not present

## 2019-07-04 DIAGNOSIS — Z87440 Personal history of urinary (tract) infections: Secondary | ICD-10-CM

## 2019-07-04 MED ORDER — NITROFURANTOIN MONOHYD MACRO 100 MG PO CAPS: 100.0000 mg | ORAL_CAPSULE | Freq: Two times a day (BID) | ORAL | 0 refills | Status: DC

## 2019-07-04 NOTE — Discharge Instructions (Addendum)
Treating you for urine infection.  Macrobid twice a day for 5 days. Make sure you are pushing fluids and drink plenty water For any continued or worsening symptoms you need to be seen in person.

## 2019-07-04 NOTE — ED Provider Notes (Addendum)
Virtual Visit via Video Note:  Michaela Mitchell  initiated request for Telemedicine visit with [facility]gent [facility] team. I connected with Michaela Mitchell  on 07/05/2019 at 8:12 AM  for a synchronized telemedicine visit using a video enabled HIPPA compliant telemedicine application. I verified that I am speaking with Michaela Mitchell  using two identifiers. Michaela July, NP  was physically located in a [facility] Urgent care site and Michaela Mitchell was located at a different location.   The limitations of evaluation and management by telemedicine as well as the availability of in-person appointments were discussed. Patient was informed that she  may incur a bill ( including co-pay) for this virtual visit encounter. Michaela Mitchell  expressed understanding and gave verbal consent to proceed with virtual visit.     History of Present Illness:Michaela Mitchell  is a 43 y.o. female presents with urinary frequency, urgency, retention. Foul urine. Mild abdominal bloating. Started Tuesday. Hx of UTI. No abdominal pain, back pain, fevers. No vaginal symptoms. No N,V  No past medical history on file.  Allergies  Allergen Reactions  . Sulfa Antibiotics     Just Celebrex  . Celebrex [Celecoxib] Rash        Observations/Objective:VITALS: Per patient if applicable, see vitals. GENERAL: Alert, appears well and in no acute distress. HEENT: Atraumatic, conjunctiva clear, no obvious abnormalities on inspection of external nose and ears. NECK: Normal movements of the head and neck. CARDIOPULMONARY: No increased WOB. Speaking in clear sentences. I:E ratio WNL.  MS: Moves all visible extremities without noticeable abnormality. PSYCH: Pleasant and cooperative, well-groomed. Speech normal rate and rhythm. Affect is appropriate. Insight and judgement are appropriate. Attention is focused, linear, and appropriate.  NEURO: CN grossly intact. Oriented as arrived to appointment on time with no  prompting. Moves both UE equally.  SKIN: No obvious lesions, wounds, erythema, or cyanosis noted on face or hands.     Assessment and Plan: UTI- treating with Macrobid   Follow Up Instructions: Follow up as needed for continued or worsening symptoms    I discussed the assessment and treatment plan with the patient. The patient was provided an opportunity to ask questions and all were answered. The patient agreed with the plan and demonstrated an understanding of the instructions.   The patient was advised to call back or seek an in-person evaluation if the symptoms worsen or if the condition fails to improve as anticipated.     Michaela July, NP  07/05/2019 8:12 AM         Michaela July, NP 07/05/19 0813    Michaela July, NP 07/05/19 [phone]

## 2019-07-12 ENCOUNTER — Other Ambulatory Visit: Payer: Self-pay | Admitting: Nurse Practitioner

## 2019-07-12 DIAGNOSIS — Z1231 Encounter for screening mammogram for malignant neoplasm of breast: Secondary | ICD-10-CM

## 2019-07-30 MED FILL — LO LOESTRIN FE 1-10 TABLET: 1 MG-10 MCG | 84 days supply | Qty: 112 | Fill #1

## 2019-09-05 DIAGNOSIS — N766 Ulceration of vulva: Secondary | ICD-10-CM | POA: Diagnosis not present

## 2019-09-05 DIAGNOSIS — R309 Painful micturition, unspecified: Secondary | ICD-10-CM | POA: Diagnosis not present

## 2019-09-05 DIAGNOSIS — N390 Urinary tract infection, site not specified: Secondary | ICD-10-CM | POA: Diagnosis not present

## 2019-09-05 DIAGNOSIS — N9089 Other specified noninflammatory disorders of vulva and perineum: Secondary | ICD-10-CM | POA: Diagnosis not present

## 2019-09-05 DIAGNOSIS — N760 Acute vaginitis: Secondary | ICD-10-CM | POA: Diagnosis not present

## 2019-09-05 MED FILL — NITROFURANTOIN MONO-MCR 100: 100 | 7 days supply | Qty: 14 | Fill #0

## 2019-09-06 ENCOUNTER — Telehealth: Payer: Self-pay | Admitting: Nurse Practitioner

## 2019-09-06 NOTE — Telephone Encounter (Signed)
Will you except TOC from [facility].

## 2019-09-06 NOTE — Telephone Encounter (Signed)
Yes-that's fine; due for CPE in November 2020- just do the appointments at same time.

## 2019-09-09 MED FILL — valACYclovir HCL 1 GM TABS: 1 | 10 days supply | Qty: 20 | Fill #0

## 2019-09-10 MED FILL — NITROFURANTOIN MONO-MCR 100: 100 | 30 days supply | Qty: 30 | Fill #0

## 2019-09-10 NOTE — Telephone Encounter (Signed)
Scheduled

## 2019-09-13 ENCOUNTER — Ambulatory Visit
Admission: RE | Admit: 2019-09-13 | Discharge: 2019-09-13 | Disposition: A | Discharge status: Home / Self Care | Payer: 59 | Source: Ambulatory Visit | Attending: Family | Admitting: Family

## 2019-09-13 ENCOUNTER — Other Ambulatory Visit: Payer: Self-pay

## 2019-09-13 ENCOUNTER — Other Ambulatory Visit: Payer: Self-pay | Admitting: Family

## 2019-09-13 DIAGNOSIS — Z1231 Encounter for screening mammogram for malignant neoplasm of breast: Secondary | ICD-10-CM | POA: Diagnosis not present

## 2019-09-18 DIAGNOSIS — Z87440 Personal history of urinary (tract) infections: Secondary | ICD-10-CM | POA: Diagnosis not present

## 2019-09-18 DIAGNOSIS — A609 Anogenital herpesviral infection, unspecified: Secondary | ICD-10-CM | POA: Diagnosis not present

## 2019-09-18 MED FILL — VALACYCLOVIR HCL 500 MG TAB: 500 | 90 days supply | Qty: 90 | Fill #0

## 2019-09-30 ENCOUNTER — Other Ambulatory Visit: Payer: Self-pay

## 2019-09-30 ENCOUNTER — Ambulatory Visit (INDEPENDENT_AMBULATORY_CARE_PROVIDER_SITE_OTHER): Payer: 59 | Admitting: Family

## 2019-09-30 ENCOUNTER — Other Ambulatory Visit (INDEPENDENT_AMBULATORY_CARE_PROVIDER_SITE_OTHER): Payer: 59

## 2019-09-30 ENCOUNTER — Encounter: Payer: Self-pay | Admitting: Family

## 2019-09-30 VITALS — BP 106/72 | HR 97 | Temp 98.3°F | Ht 59.0 in | Wt 136.8 lb

## 2019-09-30 DIAGNOSIS — Z13220 Encounter for screening for lipoid disorders: Secondary | ICD-10-CM | POA: Diagnosis not present

## 2019-09-30 DIAGNOSIS — Z0000 Encounter for general adult medical examination without abnormal findings: Secondary | ICD-10-CM | POA: Diagnosis not present

## 2019-09-30 LAB — COMPREHENSIVE METABOLIC PANEL
ALT: 14 U/L (ref 0–35)
AST: 18 U/L (ref 0–37)
Albumin: 4 g/dL (ref 3.5–5.2)
Alkaline Phosphatase: 78 U/L (ref 39–117)
BUN: 11 mg/dL (ref 6–23)
CO2: 23 mEq/L (ref 19–32)
Calcium: 9.3 mg/dL (ref 8.4–10.5)
Chloride: 106 mEq/L (ref 96–112)
Creatinine, Ser: 0.87 mg/dL (ref 0.40–1.20)
GFR: 70.98 mL/min (ref >60.00)
Glucose, Bld: 84 mg/dL (ref 70–99)
Potassium: 4.2 mEq/L (ref 3.5–5.1)
Sodium: 138 mEq/L (ref 135–145)
Total Bilirubin: 0.4 mg/dL (ref 0.2–1.2)
Total Protein: 7.2 g/dL (ref 6.0–8.3)

## 2019-09-30 LAB — CBC WITH DIFFERENTIAL/PLATELET
Basophils Absolute: 0 10*3/uL (ref 0.0–0.1)
Basophils Relative: 0.6 % (ref 0.0–3.0)
Eosinophils Absolute: 0 10*3/uL (ref 0.0–0.7)
Eosinophils Relative: 0.4 % (ref 0.0–5.0)
HCT: 42.2 % (ref 36.0–46.0)
Hemoglobin: 14.1 g/dL (ref 12.0–15.0)
Lymphocytes Relative: 27.6 % (ref 12.0–46.0)
Lymphs Abs: 2.1 10*3/uL (ref 0.7–4.0)
MCHC: 33.4 g/dL (ref 30.0–36.0)
MCV: 91.5 fl (ref 78.0–100.0)
Monocytes Absolute: 0.5 10*3/uL (ref 0.1–1.0)
Monocytes Relative: 6.1 % (ref 3.0–12.0)
Neutro Abs: 4.9 10*3/uL (ref 1.4–7.7)
Neutrophils Relative %: 65.3 % (ref 43.0–77.0)
Platelets: 219 10*3/uL (ref 150.0–400.0)
RBC: 4.61 Mil/uL (ref 3.87–5.11)
RDW: 14 % (ref 11.5–15.5)
WBC: 7.6 10*3/uL (ref 4.0–10.5)

## 2019-09-30 LAB — LIPID PANEL
Cholesterol: 199 mg/dL (ref 0–200)
HDL: 56.5 mg/dL (ref >39.00)
LDL Cholesterol: 123 mg/dL — ABNORMAL HIGH (ref 0–99)
NonHDL: 142.28
Total CHOL/HDL Ratio: 4
Triglycerides: 96 mg/dL (ref 0.0–149.0)
VLDL: 19.2 mg/dL (ref 0.0–40.0)

## 2019-09-30 LAB — TSH: TSH: 1.64 u[IU]/mL (ref 0.35–4.50)

## 2019-09-30 LAB — VITAMIN D 25 HYDROXY (VIT D DEFICIENCY, FRACTURES): VITD: 44.91 ng/mL (ref 30.00–100.00)

## 2019-09-30 NOTE — Progress Notes (Signed)
Michaela Mitchell is a 43 y.o. female with the following history as recorded in EpicCare:  Patient Active Problem List   Diagnosis Date Noted  . 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 Migraine 08/08/2016  . Patellofemoral pain syndrome 11/11/2015  . Encounter for routine adult physical exam with abnormal findings 07/21/2015  . Encounter to establish care 02/23/2015  . Tendinitis 02/23/2015  . Acne 09/06/2013  . Knee pain 03/28/2012  . Foot pain 03/28/2012    Current Outpatient Medications  Medication Sig Dispense Refill  . Ascorbic Acid (VITAMIN C) 100 MG tablet Take 100 mg by mouth daily.    . B Complex-C (SUPER B COMPLEX PO) Take 1 tablet by mouth daily.    . Biotin 5000 MCG CAPS daily.    . conjugated estrogens (PREMARIN) vaginal cream Premarin 0.625 mg/gram vaginal cream    . LO LOESTRIN FE 1 MG-10 MCG / 10 MCG tablet Take 1 tablet by mouth continuous.  12  . Multiple Vitamin (MULTIVITAMIN) tablet Take 1 tablet by mouth daily. Multi Vitamin One A Day Women's    . valACYclovir (VALTREX) 500 MG tablet Valtrex 500 mg tablet  Take 1 tablet every day by oral route.     No current facility-administered medications for this visit.     Allergies: Sulfa antibiotics and Celebrex [celecoxib]  History reviewed. No pertinent past medical history.  Past Surgical History:  Procedure Laterality Date  . WISDOM TOOTH EXTRACTION  2007    Family History  Problem Relation Age of Onset  . Alcohol abuse Father   . Heart disease Father        MI  . Arthritis Mother        rheumatoid arthritis  . Hypertension Mother   . Diabetes Mother   . COPD Mother   . Breast cancer Neg Hx     Social History   Tobacco Use  . Smoking status: Never Smoker  . Smokeless tobacco: Never Used  Substance Use Topics  . Alcohol use: No    Alcohol/week: 0.0 standard drinks    Subjective:  Patient presents for yearly CPE; in baseline state of health today; does see GYN regularly; Walks 2 x per day with her dog; goal  is to try to start exercising more regularly;   LMP- continual cycling  Health Maintenance  Topic Date Due  . PAP SMEAR-Modifier  01/12/2020  . TETANUS/TDAP  08/22/2027  . INFLUENZA VACCINE  Completed  . HIV Screening  Completed     Review of Systems  Constitutional: Negative.   HENT: Negative.   Eyes: Negative.   Respiratory: Negative.   Cardiovascular: Negative.   Gastrointestinal: Negative.   Genitourinary: Negative.   Musculoskeletal: Negative.   Skin: Negative.   Neurological: Negative.   Endo/Heme/Allergies: Negative.   Psychiatric/Behavioral: Negative.        Objective:  Vitals:   09/30/19 0928  BP: 106/72  Pulse: 97  Temp: 98.3 F (36.8 C)  TempSrc: Oral  SpO2: 99%  Weight: 136 lb 12.8 oz (62.1 kg)  Height: 4' 11" (1.499 m)    General: Well developed, well nourished, in no acute distress  Skin : Warm and dry.  Head: Normocephalic and atraumatic  Eyes: Sclera and conjunctiva clear; pupils round and reactive to light; extraocular movements intact  Ears: External normal; canals clear; tympanic membranes normal  Oropharynx: Pink, supple. No suspicious lesions  Neck: Supple without thyromegaly, adenopathy  Lungs: Respirations unlabored; clear to auscultation bilaterally without wheeze, rales, rhonchi  CVS exam: normal   rate and regular rhythm.  Abdomen: Soft; nontender; nondistended; normoactive bowel sounds; no masses or hepatosplenomegaly  Musculoskeletal: No deformities; no active joint inflammation  Extremities: No edema, cyanosis, clubbing  Vessels: Symmetric bilaterally  Neurologic: Alert and oriented; speech intact; face symmetrical; moves all extremities well; CNII-XII intact without focal deficit   Assessment:  1. PE (physical exam), annual   2. Lipid screening     Plan:  Age appropriate preventive healthcare needs addressed; encouraged regular eye doctor and dental exams; encouraged regular exercise; will update labs and refills as needed  today; follow-up to be determined;   No follow-ups on file.  Orders Placed This Encounter  Procedures  . CBC w/Diff    Standing Status:   Future    Standing Expiration Date:   09/29/2020  . Comp Met (CMET)    Standing Status:   Future    Standing Expiration Date:   09/29/2020  . Lipid panel    Standing Status:   Future    Standing Expiration Date:   09/29/2020  . TSH    Standing Status:   Future    Standing Expiration Date:   09/29/2020  . Vitamin D (25 hydroxy)    Standing Status:   Future    Standing Expiration Date:   09/29/2020    Requested Prescriptions    No prescriptions requested or ordered in this encounter     

## 2019-09-30 NOTE — Patient Instructions (Signed)
Health Maintenance, Female Adopting a healthy lifestyle and getting preventive care are important in promoting health and wellness. Ask your health care provider about:  The right schedule for you to have regular tests and exams.  Things you can do on your own to prevent diseases and keep yourself healthy. What should I know about diet, weight, and exercise? Eat a healthy diet   Eat a diet that includes plenty of vegetables, fruits, low-fat dairy products, and lean protein.  Do not eat a lot of foods that are high in solid fats, added sugars, or sodium. Maintain a healthy weight Body mass index (BMI) is used to identify weight problems. It estimates body fat based on height and weight. Your health care provider can help determine your BMI and help you achieve or maintain a healthy weight. Get regular exercise Get regular exercise. This is one of the most important things you can do for your health. Most adults should:  Exercise for at least 150 minutes each week. The exercise should increase your heart rate and make you sweat (moderate-intensity exercise).  Do strengthening exercises at least twice a week. This is in addition to the moderate-intensity exercise.  Spend less time sitting. Even light physical activity can be beneficial. Watch cholesterol and blood lipids Have your blood tested for lipids and cholesterol at 43 years of age, then have this test every 5 years. Have your cholesterol levels checked more often if:  Your lipid or cholesterol levels are high.  You are older than 43 years of age.  You are at high risk for heart disease. What should I know about cancer screening? Depending on your health history and family history, you may need to have cancer screening at various ages. This may include screening for:  Breast cancer.  Cervical cancer.  Colorectal cancer.  Skin cancer.  Lung cancer. What should I know about heart disease, diabetes, and high blood  pressure? Blood pressure and heart disease  High blood pressure causes heart disease and increases the risk of stroke. This is more likely to develop in people who have high blood pressure readings, are of African descent, or are overweight.  Have your blood pressure checked: ? Every 3-5 years if you are 18-39 years of age. ? Every year if you are 40 years old or older. Diabetes Have regular diabetes screenings. This checks your fasting blood sugar level. Have the screening done:  Once every three years after age 40 if you are at a normal weight and have a low risk for diabetes.  More often and at a younger age if you are overweight or have a high risk for diabetes. What should I know about preventing infection? Hepatitis B If you have a higher risk for hepatitis B, you should be screened for this virus. Talk with your health care provider to find out if you are at risk for hepatitis B infection. Hepatitis C Testing is recommended for:  Everyone born from 1945 through 1965.  Anyone with known risk factors for hepatitis C. Sexually transmitted infections (STIs)  Get screened for STIs, including gonorrhea and chlamydia, if: ? You are sexually active and are younger than 43 years of age. ? You are older than 43 years of age and your health care provider tells you that you are at risk for this type of infection. ? Your sexual activity has changed since you were last screened, and you are at increased risk for chlamydia or gonorrhea. Ask your health care provider if   you are at risk.  Ask your health care provider about whether you are at high risk for HIV. Your health care provider may recommend a prescription medicine to help prevent HIV infection. If you choose to take medicine to prevent HIV, you should first get tested for HIV. You should then be tested every 3 months for as long as you are taking the medicine. Pregnancy  If you are about to stop having your period (premenopausal) and  you may become pregnant, seek counseling before you get pregnant.  Take 400 to 800 micrograms (mcg) of folic acid every day if you become pregnant.  Ask for birth control (contraception) if you want to prevent pregnancy. Osteoporosis and menopause Osteoporosis is a disease in which the bones lose minerals and strength with aging. This can result in bone fractures. If you are 65 years old or older, or if you are at risk for osteoporosis and fractures, ask your health care provider if you should:  Be screened for bone loss.  Take a calcium or vitamin D supplement to lower your risk of fractures.  Be given hormone replacement therapy (HRT) to treat symptoms of menopause. Follow these instructions at home: Lifestyle  Do not use any products that contain nicotine or tobacco, such as cigarettes, e-cigarettes, and chewing tobacco. If you need help quitting, ask your health care provider.  Do not use street drugs.  Do not share needles.  Ask your health care provider for help if you need support or information about quitting drugs. Alcohol use  Do not drink alcohol if: ? Your health care provider tells you not to drink. ? You are pregnant, may be pregnant, or are planning to become pregnant.  If you drink alcohol: ? Limit how much you use to 0-1 drink a day. ? Limit intake if you are breastfeeding.  Be aware of how much alcohol is in your drink. In the [place] one drink equals one 12 oz bottle of beer (355 mL), one 5 oz glass of wine (148 mL), or one 1 oz glass of hard liquor (44 mL). General instructions  Schedule regular health, dental, and eye exams.  Stay current with your vaccines.  Tell your health care provider if: ? You often feel depressed. ? You have ever been abused or do not feel safe at home. Summary  Adopting a healthy lifestyle and getting preventive care are important in promoting health and wellness.  Follow your health care provider's instructions about healthy  diet, exercising, and getting tested or screened for diseases.  Follow your health care provider's instructions on monitoring your cholesterol and blood pressure. This information is not intended to replace advice given to you by your health care provider. Make sure you discuss any questions you have with your health care provider. Document Released: 05/30/2011 Document Revised: 11/07/2018 Document Reviewed: 11/07/2018 Elsevier Patient Education  2020 [employer] Inc[employer]  

## 2019-11-18 MED FILL — LO LOESTRIN FE 1-10 TABLET: 1 MG-10 MCG | 63 days supply | Qty: 84 | Fill #2

## 2019-12-13 DIAGNOSIS — H5203 Hypermetropia, bilateral: Secondary | ICD-10-CM | POA: Diagnosis not present

## 2020-01-27 DIAGNOSIS — Z01419 Encounter for gynecological examination (general) (routine) without abnormal findings: Secondary | ICD-10-CM | POA: Diagnosis not present

## 2020-01-27 DIAGNOSIS — Z6827 Body mass index (BMI) 27.0-27.9, adult: Secondary | ICD-10-CM | POA: Diagnosis not present

## 2020-01-27 MED FILL — LO LOESTRIN FE 1-10 TABLET: 1 MG-10 MCG | 63 days supply | Qty: 84 | Fill #0

## 2020-05-01 MED FILL — LO LOESTRIN FE 1-10 TABLET: 1 MG-10 MCG | 63 days supply | Qty: 84 | Fill #1

## 2020-07-17 MED FILL — LO LOESTRIN FE 1-10 TABLET: 1 MG-10 MCG | 63 days supply | Qty: 84 | Fill #2

## 2020-08-01 IMAGING — MG DIGITAL SCREENING BILAT W/ TOMO W/ CAD
8 series · 8 of 24 positions shown · non-contrast
Comparison: Previous exam(s).

CLINICAL DATA: Screening.

EXAM:
DIGITAL SCREENING BILATERAL MAMMOGRAM WITH TOMO AND CAD

[R MLO synth-2D]
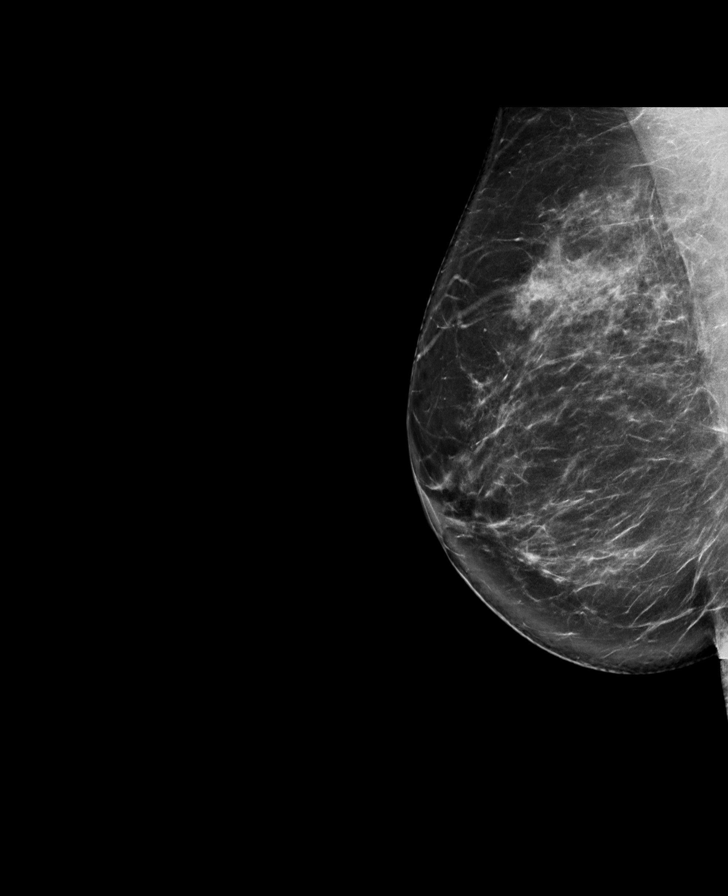

[L MLO synth-2D]
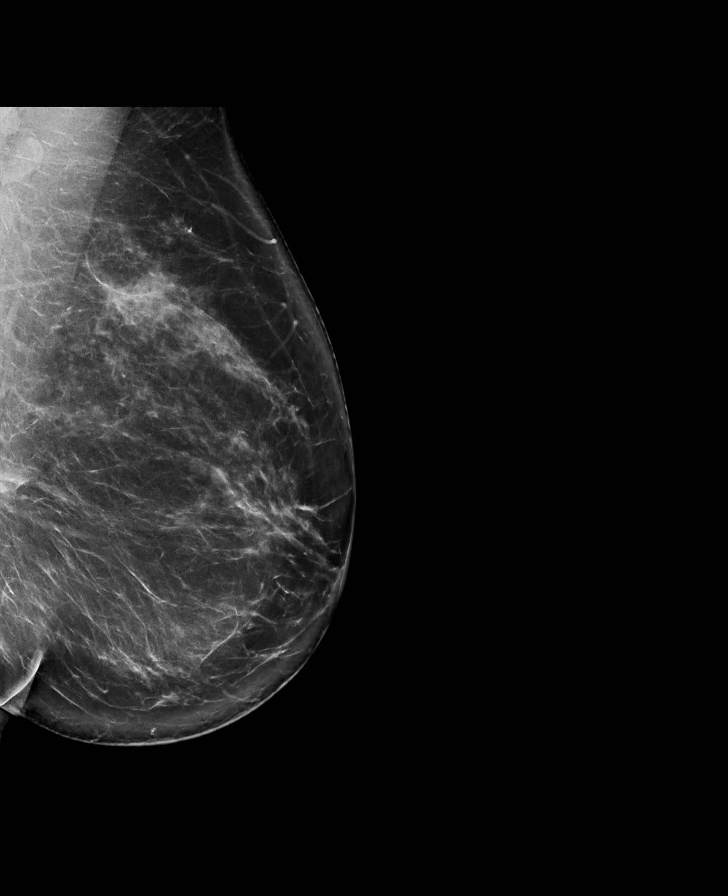

[L CC synth-2D]
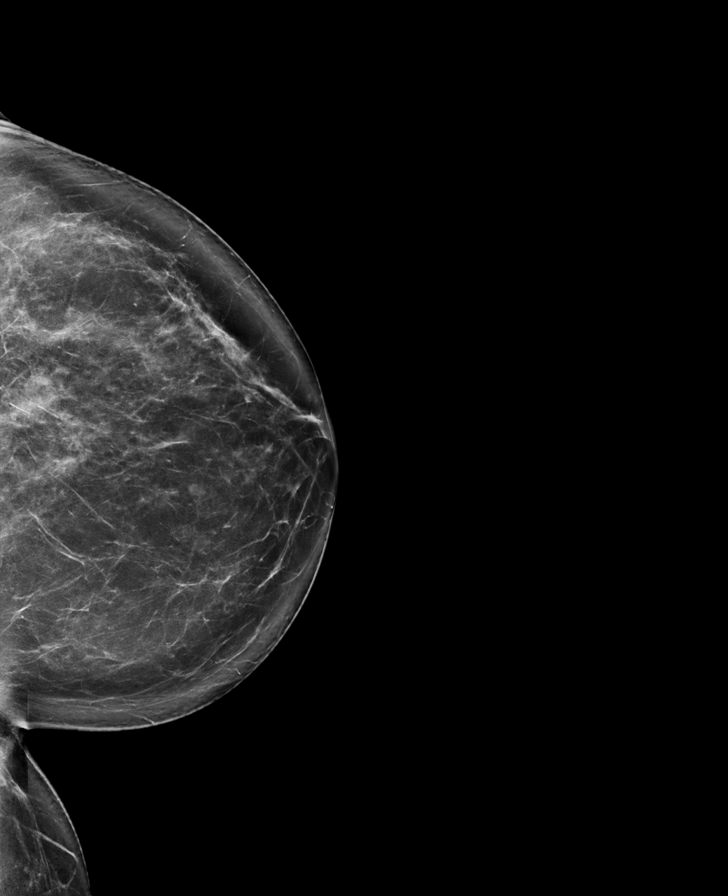

[R CC synth-2D]
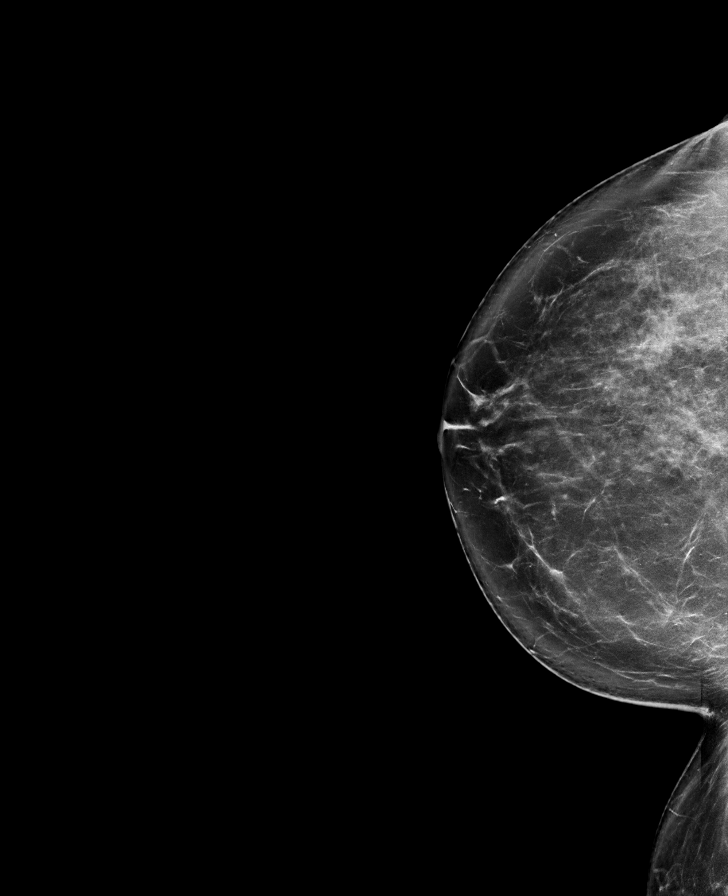

[R MLO tomo · tomo slice 47/92.0]
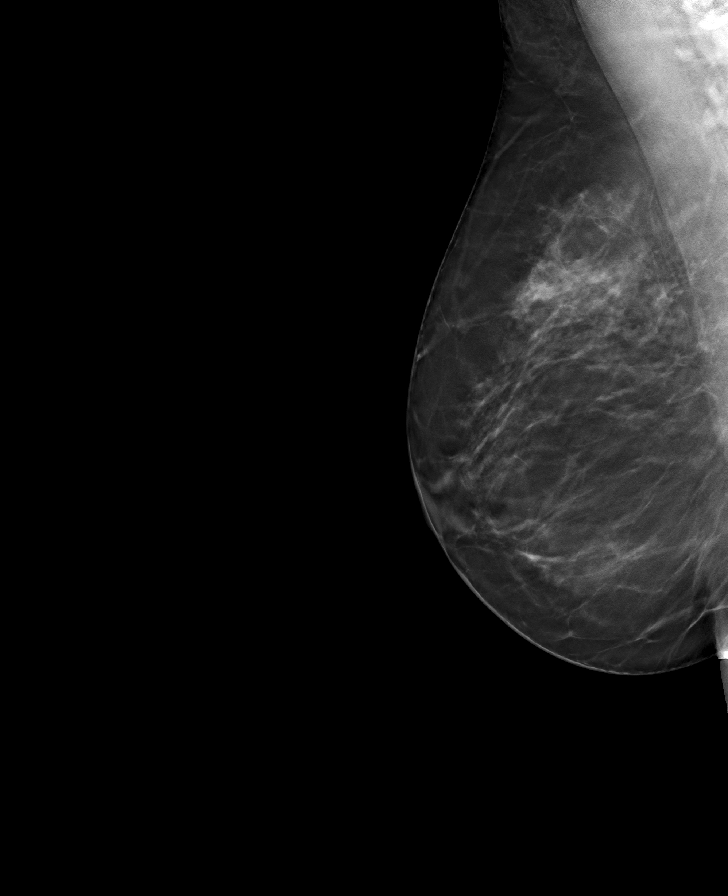

[R CC tomo · tomo slice 47/92.0]
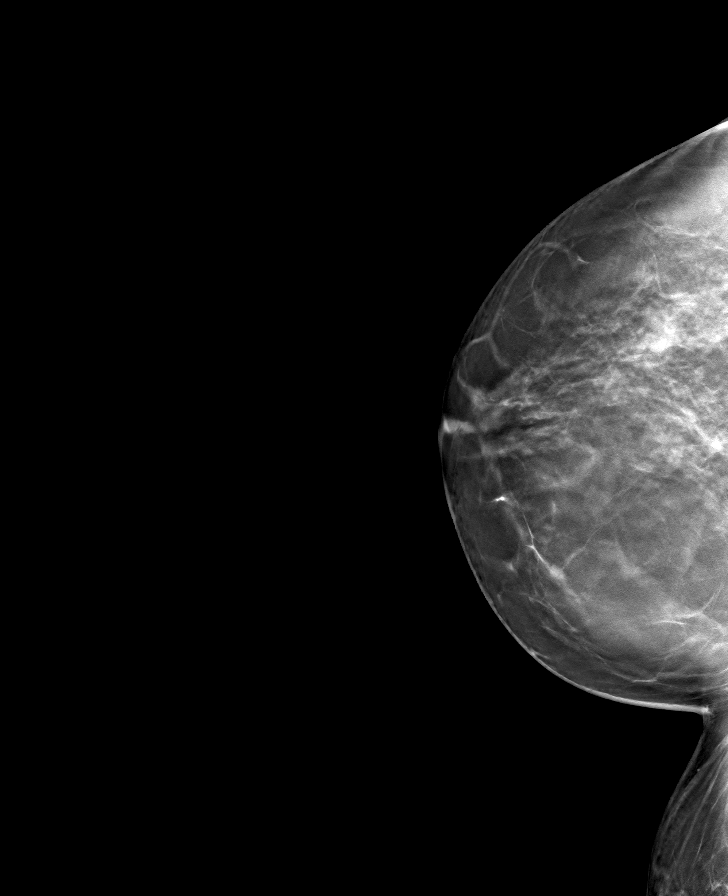

[L MLO tomo · tomo slice 50/99.0]
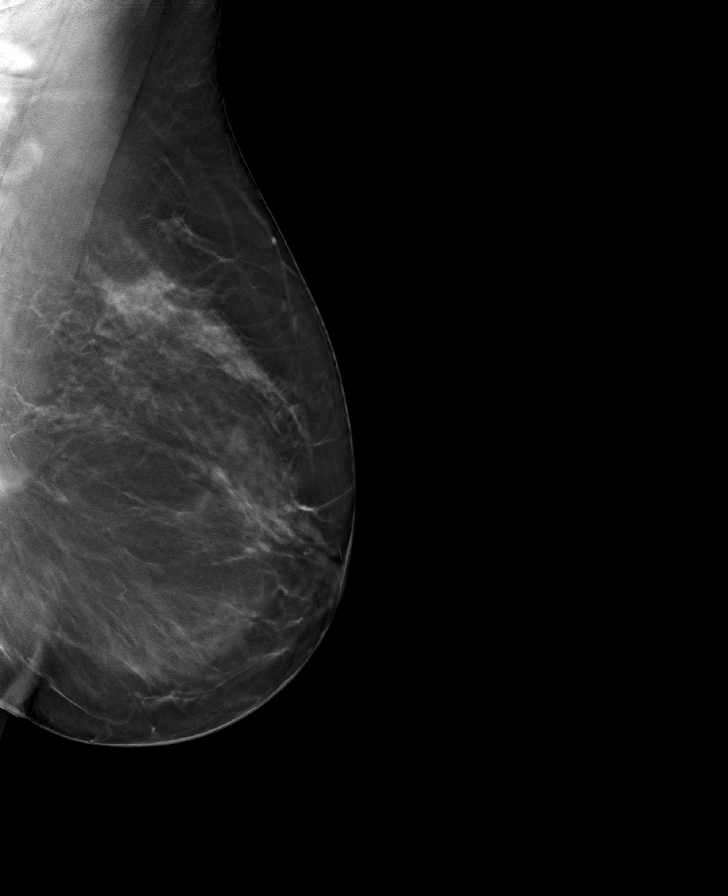

[L CC tomo · tomo slice 46/91.0]
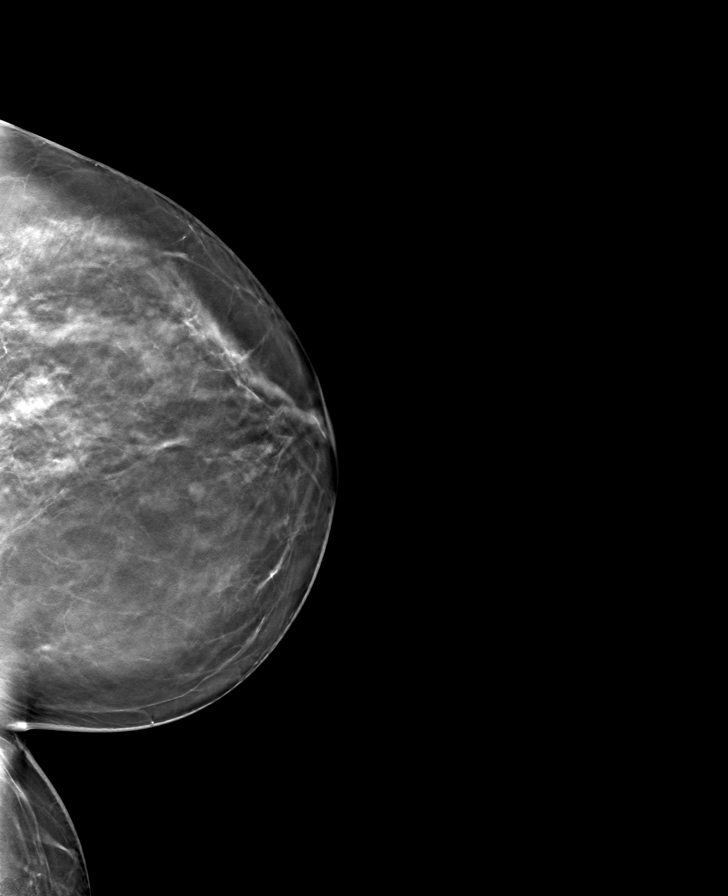

[8 of 24 positions shown; findings below may reference images not displayed]

ACR Breast Density Category b: There are scattered areas of
fibroglandular density.
FINDINGS: There are no findings suspicious for malignancy. Images were
processed with CAD.
IMPRESSION: No mammographic evidence of malignancy. A result letter of this
screening mammogram will be mailed directly to the patient.

RECOMMENDATION:
Screening mammogram in one year. (Code:[id number])

BI-RADS CATEGORY  1: Negative.

## 2020-08-04 ENCOUNTER — Other Ambulatory Visit: Payer: Self-pay | Admitting: Family

## 2020-08-04 DIAGNOSIS — Z1231 Encounter for screening mammogram for malignant neoplasm of breast: Secondary | ICD-10-CM

## 2020-09-07 MED FILL — LO LOESTRIN FE 1-10 TABLET: 1 MG-10 MCG | 63 days supply | Qty: 84 | Fill #3

## 2020-09-14 ENCOUNTER — Ambulatory Visit: Payer: 59

## 2020-10-07 ENCOUNTER — Other Ambulatory Visit: Payer: Self-pay

## 2020-10-07 ENCOUNTER — Ambulatory Visit (INDEPENDENT_AMBULATORY_CARE_PROVIDER_SITE_OTHER): Payer: 59 | Admitting: Family

## 2020-10-07 ENCOUNTER — Encounter: Payer: Self-pay | Admitting: Family

## 2020-10-07 VITALS — BP 112/74 | HR 80 | Temp 98.4°F | Ht 59.0 in | Wt 141.0 lb

## 2020-10-07 DIAGNOSIS — Z13220 Encounter for screening for lipoid disorders: Secondary | ICD-10-CM

## 2020-10-07 DIAGNOSIS — Z0000 Encounter for general adult medical examination without abnormal findings: Secondary | ICD-10-CM

## 2020-10-07 LAB — TSH: TSH: 2.43 u[IU]/mL (ref 0.35–4.50)

## 2020-10-07 LAB — CBC WITH DIFFERENTIAL/PLATELET
Basophils Absolute: 0 10*3/uL (ref 0.0–0.1)
Basophils Relative: 0.6 % (ref 0.0–3.0)
Eosinophils Absolute: 0.1 10*3/uL (ref 0.0–0.7)
Eosinophils Relative: 0.9 % (ref 0.0–5.0)
HCT: 40.9 % (ref 36.0–46.0)
Hemoglobin: 14.1 g/dL (ref 12.0–15.0)
Lymphocytes Relative: 31.9 % (ref 12.0–46.0)
Lymphs Abs: 2 10*3/uL (ref 0.7–4.0)
MCHC: 34.5 g/dL (ref 30.0–36.0)
MCV: 88.1 fl (ref 78.0–100.0)
Monocytes Absolute: 0.4 10*3/uL (ref 0.1–1.0)
Monocytes Relative: 6.8 % (ref 3.0–12.0)
Neutro Abs: 3.8 10*3/uL (ref 1.4–7.7)
Neutrophils Relative %: 59.8 % (ref 43.0–77.0)
Platelets: 223 10*3/uL (ref 150.0–400.0)
RBC: 4.65 Mil/uL (ref 3.87–5.11)
RDW: 12.9 % (ref 11.5–15.5)
WBC: 6.3 10*3/uL (ref 4.0–10.5)

## 2020-10-07 LAB — LIPID PANEL
Cholesterol: 174 mg/dL (ref 0–200)
HDL: 68.9 mg/dL (ref >39.00)
LDL Cholesterol: 92 mg/dL (ref 0–99)
NonHDL: 105.22
Total CHOL/HDL Ratio: 3
Triglycerides: 67 mg/dL (ref 0.0–149.0)
VLDL: 13.4 mg/dL (ref 0.0–40.0)

## 2020-10-07 LAB — COMPREHENSIVE METABOLIC PANEL
ALT: 10 U/L (ref 0–35)
AST: 16 U/L (ref 0–37)
Albumin: 4 g/dL (ref 3.5–5.2)
Alkaline Phosphatase: 72 U/L (ref 39–117)
BUN: 13 mg/dL (ref 6–23)
CO2: 25 mEq/L (ref 19–32)
Calcium: 8.9 mg/dL (ref 8.4–10.5)
Chloride: 108 mEq/L (ref 96–112)
Creatinine, Ser: 0.82 mg/dL (ref 0.40–1.20)
GFR: 87.09 mL/min (ref >60.00)
Glucose, Bld: 80 mg/dL (ref 70–99)
Potassium: 4.5 mEq/L (ref 3.5–5.1)
Sodium: 140 mEq/L (ref 135–145)
Total Bilirubin: 0.4 mg/dL (ref 0.2–1.2)
Total Protein: 6.8 g/dL (ref 6.0–8.3)

## 2020-10-07 NOTE — Progress Notes (Signed)
Michaela Mitchell is a 44 y.o. female with the following history as recorded in EpicCare:  Patient Active Problem List   Diagnosis Date Noted  . 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 Migraine 08/08/2016  . Patellofemoral pain syndrome 11/11/2015  . Encounter for routine adult physical exam with abnormal findings 07/21/2015  . Encounter to establish care 02/23/2015  . Tendinitis 02/23/2015  . Acne 09/06/2013  . Knee pain 03/28/2012  . Foot pain 03/28/2012    Current Outpatient Medications  Medication Sig Dispense Refill  . Ascorbic Acid (VITAMIN C) 100 MG tablet Take 100 mg by mouth daily.    . B Complex-C (SUPER B COMPLEX PO) Take 1 tablet by mouth daily.    . Biotin 5000 MCG CAPS daily.    Marland Kitchen conjugated estrogens (PREMARIN) vaginal cream Premarin 0.625 mg/gram vaginal cream    . LO LOESTRIN FE 1 MG-10 MCG / 10 MCG tablet Take 1 tablet by mouth continuous.  12  . Multiple Vitamin (MULTIVITAMIN) tablet Take 1 tablet by mouth daily. Multi Vitamin One A Day Women's     No current facility-administered medications for this visit.    Allergies: Sulfa antibiotics and Celebrex [celecoxib]  History reviewed. No pertinent past medical history.  Past Surgical History:  Procedure Laterality Date  . WISDOM TOOTH EXTRACTION  2007    Family History  Problem Relation Age of Onset  . Alcohol abuse Father   . Heart disease Father        MI  . Arthritis Mother        rheumatoid arthritis  . Hypertension Mother   . Diabetes Mother   . COPD Mother   . Breast cancer Neg Hx     Social History   Tobacco Use  . Smoking status: Never Smoker  . Smokeless tobacco: Never Used  Substance Use Topics  . Alcohol use: No    Alcohol/week: 0.0 standard drinks    Subjective:  Presents for yearly CPE; in baseline state of health; sees GYN regularly; up to date on dental and vision exams;   Review of Systems  Constitutional: Negative.   HENT: Negative.   Eyes: Negative.   Respiratory: Negative.    Cardiovascular: Negative.   Gastrointestinal: Negative.   Genitourinary: Negative.   Musculoskeletal: Negative.   Skin: Negative.   Neurological: Negative.   Endo/Heme/Allergies: Negative.   Psychiatric/Behavioral: Negative.    LMP- Continual cycling;      Objective:  Vitals:   10/07/20 0845  BP: 112/74  Pulse: 80  Temp: 98.4 F (36.9 C)  TempSrc: Oral  SpO2: 99%  Weight: 141 lb (64 kg)  Height: _0  (1.499 m)    General: Well developed, well nourished, in no acute distress  Skin : Warm and dry.  Head: Normocephalic and atraumatic  Eyes: Sclera and conjunctiva clear; pupils round and reactive to light; extraocular movements intact  Ears: External normal; canals clear; tympanic membranes normal  Oropharynx: Pink, supple. No suspicious lesions  Neck: Supple without thyromegaly, adenopathy  Lungs: Respirations unlabored; clear to auscultation bilaterally without wheeze, rales, rhonchi  CVS exam: normal rate and regular rhythm.  Abdomen: Soft; nontender; nondistended; normoactive bowel sounds; no masses or hepatosplenomegaly  Musculoskeletal: No deformities; no active joint inflammation  Extremities: No edema, cyanosis, clubbing  Vessels: Symmetric bilaterally  Neurologic: Alert and oriented; speech intact; face symmetrical; moves all extremities well; CNII-XII intact without focal deficit   Assessment:  1. PE (physical exam), annual   2. Lipid screening  Plan:  Age appropriate preventive healthcare needs addressed; encouraged regular eye doctor and dental exams; encouraged regular exercise and weight loss goals ( once MBA is completed next month); will update labs and refills as needed today; follow-up to be determined;  This visit occurred during the SARS-CoV-2 public health emergency.  Safety protocols were in place, including screening questions prior to the visit, additional usage of staff PPE, and extensive cleaning of exam room while observing appropriate  contact time as indicated for disinfecting solutions.     No follow-ups on file.  Orders Placed This Encounter  Procedures  . CBC with Differential/Platelet    Standing Status:   Future    Standing Expiration Date:   10/07/2021  . Comp Met (CMET)    Standing Status:   Future    Standing Expiration Date:   10/07/2021  . Lipid panel    Standing Status:   Future    Standing Expiration Date:   10/07/2021  . TSH    Standing Status:   Future    Standing Expiration Date:   10/07/2021    Requested Prescriptions    No prescriptions requested or ordered in this encounter

## 2020-10-16 ENCOUNTER — Other Ambulatory Visit: Payer: Self-pay

## 2020-10-16 ENCOUNTER — Ambulatory Visit
Admission: RE | Admit: 2020-10-16 | Discharge: 2020-10-16 | Disposition: A | Discharge status: Home / Self Care | Payer: 59 | Source: Ambulatory Visit | Attending: Family | Admitting: Family

## 2020-10-16 DIAGNOSIS — Z1231 Encounter for screening mammogram for malignant neoplasm of breast: Secondary | ICD-10-CM | POA: Diagnosis not present

## 2020-10-21 ENCOUNTER — Other Ambulatory Visit: Payer: Self-pay | Admitting: Family

## 2020-10-21 DIAGNOSIS — R928 Other abnormal and inconclusive findings on diagnostic imaging of breast: Secondary | ICD-10-CM

## 2020-11-19 MED FILL — LO LOESTRIN FE 1-10 TABLET: 1 MG-10 MCG | 63 days supply | Qty: 84 | Fill #4

## 2020-11-26 ENCOUNTER — Other Ambulatory Visit: Payer: Self-pay

## 2020-11-26 ENCOUNTER — Ambulatory Visit
Admission: RE | Admit: 2020-11-26 | Discharge: 2020-11-26 | Disposition: A | Discharge status: Home / Self Care | Payer: 59 | Source: Ambulatory Visit | Attending: Family | Admitting: Family

## 2020-11-26 DIAGNOSIS — N6002 Solitary cyst of left breast: Secondary | ICD-10-CM | POA: Diagnosis not present

## 2020-11-26 DIAGNOSIS — R928 Other abnormal and inconclusive findings on diagnostic imaging of breast: Secondary | ICD-10-CM

## 2021-01-26 ENCOUNTER — Other Ambulatory Visit (HOSPITAL_COMMUNITY): Payer: Self-pay | Admitting: Obstetrics and Gynecology

## 2021-01-26 MED FILL — LO LOESTRIN FE 1-10 TABLET: 1 MG-10 MCG | 78 days supply | Qty: 84 | Fill #0

## 2021-02-08 DIAGNOSIS — Z01419 Encounter for gynecological examination (general) (routine) without abnormal findings: Secondary | ICD-10-CM | POA: Diagnosis not present

## 2021-02-08 DIAGNOSIS — Z6828 Body mass index (BMI) 28.0-28.9, adult: Secondary | ICD-10-CM | POA: Diagnosis not present

## 2021-02-15 ENCOUNTER — Other Ambulatory Visit (HOSPITAL_BASED_OUTPATIENT_CLINIC_OR_DEPARTMENT_OTHER): Payer: Self-pay

## 2021-02-17 DIAGNOSIS — H52221 Regular astigmatism, right eye: Secondary | ICD-10-CM | POA: Diagnosis not present

## 2021-02-17 DIAGNOSIS — H5203 Hypermetropia, bilateral: Secondary | ICD-10-CM | POA: Diagnosis not present

## 2021-06-28 ENCOUNTER — Other Ambulatory Visit (HOSPITAL_COMMUNITY): Payer: Self-pay

## 2021-06-28 MED ORDER — LO LOESTRIN FE 1 MG-10 MCG / 10 MCG PO TABS
ORAL_TABLET | ORAL | 1 refills | Status: DC
  Filled 2021-06-28: qty 84, 84d supply, fill #0
  Filled 2021-09-03: qty 84, 84d supply, fill #1

## 2021-09-03 ENCOUNTER — Other Ambulatory Visit: Payer: Self-pay | Admitting: Family

## 2021-09-03 ENCOUNTER — Other Ambulatory Visit (HOSPITAL_COMMUNITY): Payer: Self-pay

## 2021-09-03 ENCOUNTER — Encounter: Payer: Self-pay | Admitting: Family

## 2021-09-03 DIAGNOSIS — Z1231 Encounter for screening mammogram for malignant neoplasm of breast: Secondary | ICD-10-CM

## 2021-10-08 ENCOUNTER — Encounter: Payer: 59 | Admitting: Family

## 2021-10-13 ENCOUNTER — Other Ambulatory Visit (HOSPITAL_COMMUNITY): Payer: Self-pay

## 2021-10-29 ENCOUNTER — Ambulatory Visit
Admission: RE | Admit: 2021-10-29 | Discharge: 2021-10-29 | Disposition: A | Discharge status: Home / Self Care | Payer: 59 | Source: Ambulatory Visit | Attending: Family | Admitting: Family

## 2021-10-29 ENCOUNTER — Other Ambulatory Visit: Payer: Self-pay | Admitting: Obstetrics and Gynecology

## 2021-10-29 DIAGNOSIS — Z1231 Encounter for screening mammogram for malignant neoplasm of breast: Secondary | ICD-10-CM

## 2021-12-03 ENCOUNTER — Ambulatory Visit (INDEPENDENT_AMBULATORY_CARE_PROVIDER_SITE_OTHER): Payer: 59 | Admitting: Family

## 2021-12-03 ENCOUNTER — Encounter: Payer: Self-pay | Admitting: Family

## 2021-12-03 VITALS — BP 120/70 | HR 77 | Temp 98.8°F | Ht 59.0 in | Wt 134.0 lb

## 2021-12-03 DIAGNOSIS — Z13220 Encounter for screening for lipoid disorders: Secondary | ICD-10-CM

## 2021-12-03 DIAGNOSIS — Z1211 Encounter for screening for malignant neoplasm of colon: Secondary | ICD-10-CM | POA: Diagnosis not present

## 2021-12-03 DIAGNOSIS — Z0000 Encounter for general adult medical examination without abnormal findings: Secondary | ICD-10-CM

## 2021-12-03 LAB — CBC WITH DIFFERENTIAL/PLATELET
Basophils Absolute: 0 10*3/uL (ref 0.0–0.1)
Basophils Relative: 0.4 % (ref 0.0–3.0)
Eosinophils Absolute: 0 10*3/uL (ref 0.0–0.7)
Eosinophils Relative: 0.3 % (ref 0.0–5.0)
HCT: 41.8 % (ref 36.0–46.0)
Hemoglobin: 14.1 g/dL (ref 12.0–15.0)
Lymphocytes Relative: 28.1 % (ref 12.0–46.0)
Lymphs Abs: 2 10*3/uL (ref 0.7–4.0)
MCHC: 33.8 g/dL (ref 30.0–36.0)
MCV: 88.6 fl (ref 78.0–100.0)
Monocytes Absolute: 0.4 10*3/uL (ref 0.1–1.0)
Monocytes Relative: 6 % (ref 3.0–12.0)
Neutro Abs: 4.6 10*3/uL (ref 1.4–7.7)
Neutrophils Relative %: 65.2 % (ref 43.0–77.0)
Platelets: 216 10*3/uL (ref 150.0–400.0)
RBC: 4.71 Mil/uL (ref 3.87–5.11)
RDW: 12.8 % (ref 11.5–15.5)
WBC: 7.1 10*3/uL (ref 4.0–10.5)

## 2021-12-03 LAB — COMPREHENSIVE METABOLIC PANEL
ALT: 11 U/L (ref 0–35)
AST: 14 U/L (ref 0–37)
Albumin: 4.2 g/dL (ref 3.5–5.2)
Alkaline Phosphatase: 74 U/L (ref 39–117)
BUN: 16 mg/dL (ref 6–23)
CO2: 26 mEq/L (ref 19–32)
Calcium: 9.6 mg/dL (ref 8.4–10.5)
Chloride: 103 mEq/L (ref 96–112)
Creatinine, Ser: 0.88 mg/dL (ref 0.40–1.20)
GFR: 79.36 mL/min (ref >60.00)
Glucose, Bld: 75 mg/dL (ref 70–99)
Potassium: 4.3 mEq/L (ref 3.5–5.1)
Sodium: 139 mEq/L (ref 135–145)
Total Bilirubin: 0.5 mg/dL (ref 0.2–1.2)
Total Protein: 6.9 g/dL (ref 6.0–8.3)

## 2021-12-03 LAB — LIPID PANEL
Cholesterol: 211 mg/dL — ABNORMAL HIGH (ref 0–200)
HDL: 63.4 mg/dL (ref >39.00)
LDL Cholesterol: 127 mg/dL — ABNORMAL HIGH (ref 0–99)
NonHDL: 147.61
Total CHOL/HDL Ratio: 3
Triglycerides: 101 mg/dL (ref 0.0–149.0)
VLDL: 20.2 mg/dL (ref 0.0–40.0)

## 2021-12-03 LAB — TSH: TSH: 2.48 u[IU]/mL (ref 0.35–5.50)

## 2021-12-03 NOTE — Progress Notes (Signed)
Michaela Mitchell is a 46 y.o. 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     B Complex-C (SUPER B COMPLEX PO) Take 1 tablet by mouth daily.     conjugated estrogens (PREMARIN) vaginal cream Premarin 0.625 mg/gram vaginal cream     Multiple Vitamin (MULTIVITAMIN) tablet Take 1 tablet by mouth daily. Multi Vitamin One A Day Women's     Norethindrone-Ethinyl Estradiol-Fe Biphas (LO LOESTRIN FE) 1 MG-10 MCG / 10 MCG tablet TAKE 1 TABLET BY MOUTH DAILY CONTINUOUSLY 84 tablet 1   No current facility-administered medications for this visit.    Allergies: Sulfa antibiotics and Celebrex [celecoxib]  No past medical history on file.  Past Surgical History:  Procedure Laterality Date   WISDOM TOOTH EXTRACTION  2007    Family History  Problem Relation Age of Onset   Alcohol abuse Father    Heart disease Father        MI   Arthritis Mother        rheumatoid arthritis   Hypertension Mother    Diabetes Mother    COPD Mother    Breast cancer Neg Hx     Social History   Tobacco Use   Smoking status: Never   Smokeless tobacco: Never  Substance Use Topics   Alcohol use: No    Alcohol/week: 0.0 standard drinks    Subjective:  Presents for yearly exam; no acute concerns; seeing GYN regularly; mammogram up to date; up to date with dental and vision exams;   LMP- continual cycling  Review of Systems  Constitutional:  Positive for weight loss.       Planned weight loss  HENT: Negative.     Eyes: Negative.   Respiratory: Negative.    Cardiovascular: Negative.   Gastrointestinal: Negative.   Genitourinary: Negative.   Musculoskeletal: Negative.   Skin: Negative.   Neurological: Negative.   Endo/Heme/Allergies: Negative.   Psychiatric/Behavioral: Negative.        Objective:  Vitals:   12/03/21 0944  BP: 120/70  Pulse: 77  Temp: 98.8 F (37.1 C)  TempSrc: Oral  SpO2: 97%  Weight: 134 lb (60.8 kg)  Height: 4' 11" (1.499 m)    General: Well developed, well nourished, in no acute distress  Skin : Warm and dry.  Head: Normocephalic and atraumatic  Eyes: Sclera and conjunctiva clear; pupils round and reactive to light; extraocular movements intact  Ears: External normal; canals clear; tympanic membranes normal  Oropharynx: Pink, supple. No suspicious lesions  Neck: Supple without thyromegaly, adenopathy  Lungs: Respirations unlabored; clear to auscultation bilaterally without wheeze, rales, rhonchi  CVS exam: normal rate and regular rhythm.  Abdomen: Soft; nontender; nondistended; normoactive bowel sounds; no masses or hepatosplenomegaly  Musculoskeletal: No deformities; no active joint inflammation  Extremities: No edema, cyanosis, clubbing  Vessels: Symmetric bilaterally  Neurologic: Alert and oriented; speech intact; face symmetrical; moves all extremities well; CNII-XII intact without focal deficit   Assessment:  1. PE (physical exam),  annual   2. Encounter for screening colonoscopy   3. Lipid screening     Plan:  Age appropriate preventive healthcare needs addressed; encouraged regular eye doctor and dental exams; encouraged regular exercise; will update labs and refills as needed today; follow-up to be determined; Congratulated patient on commitment to health; she will see her GYN in 12/2021;  This visit occurred during the SARS-CoV-2 public health emergency.  Safety protocols were in place, including screening questions prior to the visit, additional  usage of staff PPE, and extensive cleaning of exam room while observing appropriate contact time as indicated for disinfecting solutions.    No follow-ups on file.  Orders Placed This Encounter  Procedures   CBC with Differential/Platelet   Comp Met (CMET)   Lipid panel   TSH   Ambulatory referral to Gastroenterology    Referral Priority:   Routine    Referral Type:   Consultation    Referral Reason:   Specialty Services Required    Referred to Provider:   Gatha Mayer, MD    Number of Visits Requested:   1    Requested Prescriptions    No prescriptions requested or ordered in this encounter

## 2021-12-06 ENCOUNTER — Other Ambulatory Visit (HOSPITAL_COMMUNITY): Payer: Self-pay

## 2021-12-06 MED ORDER — LO LOESTRIN FE 1 MG-10 MCG / 10 MCG PO TABS
ORAL_TABLET | ORAL | 0 refills | Status: DC
  Filled 2021-12-06: qty 84, 72d supply, fill #0

## 2021-12-23 ENCOUNTER — Encounter: Payer: Self-pay | Admitting: Internal Medicine

## 2022-01-24 ENCOUNTER — Other Ambulatory Visit (HOSPITAL_COMMUNITY): Payer: Self-pay

## 2022-01-24 MED ORDER — NITROFURANTOIN MACROCRYSTAL 50 MG PO CAPS
ORAL_CAPSULE | ORAL | 3 refills | Status: DC
  Filled 2022-01-24: qty 30, 30d supply, fill #0
  Filled 2022-09-23: qty 30, 30d supply, fill #1
  Filled 2022-11-04: qty 30, 30d supply, fill #2

## 2022-01-24 MED ORDER — LO LOESTRIN FE 1 MG-10 MCG / 10 MCG PO TABS
ORAL_TABLET | ORAL | 0 refills | Status: DC
  Filled 2022-01-24: qty 84, 84d supply, fill #0

## 2022-01-27 ENCOUNTER — Other Ambulatory Visit (HOSPITAL_COMMUNITY): Payer: Self-pay

## 2022-02-04 ENCOUNTER — Other Ambulatory Visit (HOSPITAL_COMMUNITY): Payer: Self-pay

## 2022-02-04 ENCOUNTER — Ambulatory Visit (AMBULATORY_SURGERY_CENTER): Payer: 59

## 2022-02-04 ENCOUNTER — Other Ambulatory Visit: Payer: Self-pay

## 2022-02-04 VITALS — Ht 59.0 in | Wt 130.0 lb

## 2022-02-04 DIAGNOSIS — Z1211 Encounter for screening for malignant neoplasm of colon: Secondary | ICD-10-CM

## 2022-02-04 MED ORDER — NA SULFATE-K SULFATE-MG SULF 17.5-3.13-1.6 GM/177ML PO SOLN
1.0000 | Freq: Once | ORAL | 0 refills | Status: AC
  Filled 2022-02-04: qty 354, 1d supply, fill #0

## 2022-02-04 NOTE — Progress Notes (Signed)
Denies allergies to eggs or soy products. Denies complication of anesthesia or sedation. Denies use of weight loss medication. Denies use of O2.   Emmi instructions given for colonoscopy.  

## 2022-02-11 ENCOUNTER — Other Ambulatory Visit (HOSPITAL_COMMUNITY): Payer: Self-pay

## 2022-02-11 ENCOUNTER — Encounter: Payer: 59 | Admitting: Internal Medicine

## 2022-02-11 DIAGNOSIS — R319 Hematuria, unspecified: Secondary | ICD-10-CM | POA: Diagnosis not present

## 2022-02-11 DIAGNOSIS — Z01419 Encounter for gynecological examination (general) (routine) without abnormal findings: Secondary | ICD-10-CM | POA: Diagnosis not present

## 2022-02-11 DIAGNOSIS — Z1151 Encounter for screening for human papillomavirus (HPV): Secondary | ICD-10-CM | POA: Diagnosis not present

## 2022-02-11 DIAGNOSIS — Z124 Encounter for screening for malignant neoplasm of cervix: Secondary | ICD-10-CM | POA: Diagnosis not present

## 2022-02-11 DIAGNOSIS — Z6826 Body mass index (BMI) 26.0-26.9, adult: Secondary | ICD-10-CM | POA: Diagnosis not present

## 2022-02-11 LAB — HM PAP SMEAR
HM Pap smear: NEGATIVE
HPV, high-risk: NEGATIVE

## 2022-02-11 MED ORDER — LO LOESTRIN FE 1 MG-10 MCG / 10 MCG PO TABS
ORAL_TABLET | ORAL | 4 refills | Status: DC
  Filled 2022-02-11: qty 84, 72d supply, fill #0
  Filled 2022-04-16: qty 112, 84d supply, fill #1
  Filled 2022-04-19: qty 28, 24d supply, fill #1
  Filled 2022-04-19: qty 84, 72d supply, fill #1
  Filled 2022-06-24: qty 84, 72d supply, fill #2
  Filled 2022-09-09: qty 84, 72d supply, fill #3
  Filled 2022-11-01 – 2022-11-17 (×2): qty 84, 72d supply, fill #4

## 2022-02-16 ENCOUNTER — Encounter: Payer: Self-pay | Admitting: Internal Medicine

## 2022-02-18 ENCOUNTER — Other Ambulatory Visit (HOSPITAL_COMMUNITY): Payer: Self-pay

## 2022-02-18 ENCOUNTER — Other Ambulatory Visit: Payer: Self-pay

## 2022-02-18 ENCOUNTER — Encounter: Payer: Self-pay | Admitting: Internal Medicine

## 2022-02-18 ENCOUNTER — Ambulatory Visit (AMBULATORY_SURGERY_CENTER): Payer: 59 | Admitting: Internal Medicine

## 2022-02-18 VITALS — BP 105/70 | HR 60 | Temp 98.1°F | Resp 14 | Ht 59.0 in | Wt 130.0 lb

## 2022-02-18 DIAGNOSIS — K635 Polyp of colon: Secondary | ICD-10-CM | POA: Diagnosis not present

## 2022-02-18 DIAGNOSIS — Z860100 Personal history of colon polyps, unspecified: Secondary | ICD-10-CM | POA: Insufficient documentation

## 2022-02-18 DIAGNOSIS — D120 Benign neoplasm of cecum: Secondary | ICD-10-CM

## 2022-02-18 DIAGNOSIS — Z1211 Encounter for screening for malignant neoplasm of colon: Secondary | ICD-10-CM | POA: Diagnosis not present

## 2022-02-18 MED ORDER — SODIUM CHLORIDE 0.9 % IV SOLN: 500.0000 mL | INTRAVENOUS | Status: DC

## 2022-02-18 NOTE — Progress Notes (Signed)
[facility] History and Physical ? ? ?Primary Care Physician:  Marrian Salvage, [place] ? ? ?Reason for Procedure:   CRCA screening ? ?Plan:    colonoscopy ? ? ? ? ?HPI: Michaela Mitchell is a 46 y.o. female here for screening ? ? ?History reviewed. No pertinent past medical history. ? ?Past Surgical History:  ?Procedure Laterality Date  ? [place] EXTRACTION  2007  ? ? ?Prior to Admission medications   ?Medication Sig Start Date End Date Taking? Authorizing Provider  ?Ascorbic Acid (VITAMIN C) 100 MG tablet Take 100 mg by mouth daily.   Yes [provider]  ?B Complex-C (SUPER B COMPLEX PO) Take 1 tablet by mouth daily.   Yes [provider]  ?conjugated estrogens (PREMARIN) vaginal cream Premarin 0.625 mg/gram vaginal cream   Yes [provider]  ?Multiple Vitamin (MULTIVITAMIN) tablet Take 1 tablet by mouth daily. Multi Vitamin One A Day Women's   Yes [provider]  ?Norethindrone-Ethinyl Estradiol-Fe Biphas (LO LOESTRIN FE) 1 MG-10 MCG / 10 MCG tablet TAKE 1 TABLET BY MOUTH DAILY CONTINUOUSLY 02/11/22  Yes   ?loratadine (CLARITIN) 10 MG tablet Take 10 mg by mouth daily.    [provider]  ?nitrofurantoin (MACRODANTIN) 50 MG capsule Take 1 capsule by mouth post intercourse as needed 01/24/22     ? ? ?Current Outpatient Medications  ?Medication Sig Dispense Refill  ? Ascorbic Acid (VITAMIN C) 100 MG tablet Take 100 mg by mouth daily.    ? B Complex-C (SUPER B COMPLEX PO) Take 1 tablet by mouth daily.    ? conjugated estrogens (PREMARIN) vaginal cream Premarin 0.625 mg/gram vaginal cream    ? Multiple Vitamin (MULTIVITAMIN) tablet Take 1 tablet by mouth daily. Multi Vitamin One A Day Women's    ? Norethindrone-Ethinyl Estradiol-Fe Biphas (LO LOESTRIN FE) 1 MG-10 MCG / 10 MCG tablet TAKE 1 TABLET BY MOUTH DAILY CONTINUOUSLY 84 tablet 4  ? loratadine (CLARITIN) 10 MG tablet Take 10 mg by mouth daily.    ? nitrofurantoin (MACRODANTIN) 50 MG capsule  Take 1 capsule by mouth post intercourse as needed 30 capsule 3  ? ?Current Facility-Administered Medications  ?Medication Dose Route Frequency Provider Last Rate Last Admin  ? 0.9 %  sodium chloride infusion  500 mL Intravenous Continuous Gatha Mayer, MD      ? ? ?Allergies as of 02/18/2022 - Review Complete 02/18/2022  ?Allergen Reaction Noted  ? Sulfa antibiotics  09/06/2013  ? Celebrex [celecoxib] Rash 02/17/2015  ? ? ?Family History  ?Problem Relation Age of Onset  ? Arthritis Mother   ?     rheumatoid arthritis  ? Hypertension Mother   ? Diabetes Mother   ? COPD Mother   ? Alcohol abuse Father   ? Heart disease Father   ?     MI  ? Breast cancer Neg Hx   ? Colon cancer Neg Hx   ? Esophageal cancer Neg Hx   ? Rectal cancer Neg Hx   ? Stomach cancer Neg Hx   ? ? ?Social History  ? ?Socioeconomic History  ? Marital status: Married  ?  Spouse name: Not on file  ? Number of children: Not on file  ? Years of education: Not on file  ? Highest education level: Not on file  ?Occupational History  ? Not on file  ?Tobacco Use  ? Smoking status: Never  ? Smokeless tobacco: Never  ?Substance and Sexual Activity  ? Alcohol use: Yes  ?  Comment: Rare  ? Drug use: No  ? Sexual activity: Not on file  ?Other Topics Concern  ? Not on file  ?Social History Narrative  ? At [employer] in [place].   ? Lives in [place] and commute 35 minutes.   ? Husband and dog.   ? Exercise- work out videos, zumba, runs intermittently; doesn't eat cheese as causes migraines.   ? Diet-eats lots of fruits and vegetables  ? ?Social Determinants of Health  ? ?Financial Resource Strain: Not on file  ?Food Insecurity: Not on file  ?Transportation Needs: Not on file  ?Physical Activity: Not on file  ?Stress: Not on file  ?Social Connections: Not on file  ?Intimate Partner Violence: Not on file  ? ? ?Review of Systems: ? ?All other review of systems negative except as mentioned in the HPI. ? ?Physical Exam: ?Vital signs ?BP (!) 120/50   Pulse 83   Temp 98.1  ?F (36.7 ?C)   Ht '4\' 11"'$  (1.499 m)   Wt 130 lb (59 kg)   SpO2 96%   BMI 26.26 kg/m?  ? ?General:   Alert,  Well-developed, well-nourished, pleasant and cooperative in NAD ?Lungs:  Clear throughout to auscultation.   ?Heart:  Regular rate and rhythm; no murmurs, clicks, rubs,  or gallops. ?Abdomen:  Soft, nontender and nondistended. Normal bowel sounds.   ?Neuro/Psych:  Alert and cooperative. Normal mood and affect. A and O x 3 ? ? ?'@Gurpreet Mariani'$  Simonne Maffucci, MD, [facility] ?[facility] ?[phone] (pager) ?02/18/2022 8:51 AM@ ? ?

## 2022-02-18 NOTE — Patient Instructions (Addendum)
I found and removed one small polyp that looks benign.  ?All else ok. ? ?I will let you know pathology results and when to have another routine colonoscopy by mail and/or My Chart. ? ?I appreciate the opportunity to care for you. ?Gatha Mayer, MD, [facility] ? ?Handout on polyps provided  ? ?YOU HAD AN ENDOSCOPIC PROCEDURE TODAY AT [facility]:   Refer to the procedure report that was given to you for any specific questions about what was found during the examination.  If the procedure report does not answer your questions, please call your gastroenterologist to clarify.  If you requested that your care partner not be given the details of your procedure findings, then the procedure report has been included in a sealed envelope for you to review at your convenience later. ? ?YOU SHOULD EXPECT: Some feelings of bloating in the abdomen. Passage of more gas than usual.  Walking can help get rid of the air that was put into your GI tract during the procedure and reduce the bloating. If you had a lower endoscopy (such as a colonoscopy or flexible sigmoidoscopy) you may notice spotting of blood in your stool or on the toilet paper. If you underwent a bowel prep for your procedure, you may not have a normal bowel movement for a few days. ? ?Please Note:  You might notice some irritation and congestion in your nose or some drainage.  This is from the oxygen used during your procedure.  There is no need for concern and it should clear up in a day or so. ? ?SYMPTOMS TO REPORT IMMEDIATELY: ? ?Following lower endoscopy (colonoscopy or flexible sigmoidoscopy): ? Excessive amounts of blood in the stool ? Significant tenderness or worsening of abdominal pains ? Swelling of the abdomen that is new, acute ? Fever of 100?F or higher ? ?For urgent or emergent issues, a gastroenterologist can be reached at any hour by calling [phone]. ?Do not use MyChart messaging for urgent concerns.  ? ? ?DIET:  We do recommend a  small meal at first, but then you may proceed to your regular diet.  Drink plenty of fluids but you should avoid alcoholic beverages for 24 hours. ? ?ACTIVITY:  You should plan to take it easy for the rest of today and you should NOT DRIVE or use heavy machinery until tomorrow (because of the sedation medicines used during the test).   ? ?FOLLOW UP: ?Our staff will call the number listed on your records 48-72 hours following your procedure to check on you and address any questions or concerns that you may have regarding the information given to you following your procedure. If we do not reach you, we will leave a message.  We will attempt to reach you two times.  During this call, we will ask if you have developed any symptoms of COVID 19. If you develop any symptoms (ie: fever, flu-like symptoms, shortness of breath, cough etc.) before then, please call [phone].  If you test positive for Covid 19 in the 2 weeks post procedure, please call and report this information to [place].   ? ?If any biopsies were taken you will be contacted by phone or by letter within the next 1-3 weeks.  Please call us at [phone] if you have not heard about the biopsies in 3 weeks.  ? ? ?SIGNATURES/CONFIDENTIALITY: ?You and/or your care partner have signed paperwork which will be entered into your electronic medical record.  These signatures attest  to the fact that that the information above on your After Visit Summary has been reviewed and is understood.  Full responsibility of the confidentiality of this discharge information lies with you and/or your care-partner. ? ? ?

## 2022-02-18 NOTE — Op Note (Signed)
[facility] ?Patient Name: Michaela Mitchell ?Procedure Date: 02/18/2022 8:54 AM ?MRN: [id number] ?Endoscopist: Gatha Mayer , MD ?Age: 46 ?Referring MD:  ?Date of Birth: 10-09-1976 ?Gender: Female ?Account #: [id number] ?Procedure:                Colonoscopy ?Indications:              Screening for colorectal malignant neoplasm, This  ?                          is the patient's first colonoscopy ?Medicines:                Propofol per Anesthesia, Monitored Anesthesia Care ?Procedure:                Pre-Anesthesia Assessment: ?                          - Prior to the procedure, a History and Physical  ?                          was performed, and patient medications and  ?                          allergies were reviewed. The patient's tolerance of  ?                          previous anesthesia was also reviewed. The risks  ?                          and benefits of the procedure and the sedation  ?                          options and risks were discussed with the patient.  ?                          All questions were answered, and informed consent  ?                          was obtained. Prior Anticoagulants: The patient has  ?                          taken no previous anticoagulant or antiplatelet  ?                          agents. ASA Grade Assessment: II - A patient with  ?                          mild systemic disease. After reviewing the risks  ?                          and benefits, the patient was deemed in  ?                          satisfactory condition to undergo the procedure. ?  After obtaining informed consent, the colonoscope  ?                          was passed under direct vision. Throughout the  ?                          procedure, the patient's blood pressure, pulse, and  ?                          oxygen saturations were monitored continuously. The  ?                          Olympus PCF-H190DL ([id number]) Colonoscope was  ?                           introduced through the anus and advanced to the the  ?                          cecum, identified by appendiceal orifice and  ?                          ileocecal valve. The colonoscopy was performed  ?                          without difficulty. The patient tolerated the  ?                          procedure well. The quality of the bowel  ?                          preparation was excellent. The ileocecal valve,  ?                          appendiceal orifice, and rectum were photographed.  ?                          The bowel preparation used was SUPREP via split  ?                          dose instruction. ?Scope In: 8:59:49 AM ?Scope Out: 9:12:35 AM ?Scope Withdrawal Time: 0 hours 9 minutes 46 seconds  ?Total Procedure Duration: 0 hours 12 minutes 46 seconds  ?Findings:                 The perianal and digital rectal examinations were  ?                          normal. ?                          A 8 mm polyp was found in the cecum. The polyp was  ?                          sessile. The polyp was removed with a cold snare.  ?  Resection and retrieval were complete. Verification  ?                          of patient identification for the specimen was  ?                          done. Estimated blood loss was minimal. ?                          The exam was otherwise without abnormality on  ?                          direct and retroflexion views. ?Complications:            No immediate complications. ?Estimated Blood Loss:     Estimated blood loss was minimal. ?Impression:               - One 8 mm polyp in the cecum, removed with a cold  ?                          snare. Resected and retrieved. ?                          - The examination was otherwise normal on direct  ?                          and retroflexion views. ?Recommendation:           - Patient has a contact number available for  ?                          emergencies. The signs and symptoms of potential  ?                           delayed complications were discussed with the  ?                          patient. Return to normal activities tomorrow.  ?                          Written discharge instructions were provided to the  ?                          patient. ?                          - Resume previous diet. ?                          - Continue present medications. ?                          - Await pathology results. ?                          - Repeat colonoscopy is recommended. The  ?  colonoscopy date will be determined after pathology  ?                          results from today's exam become available for  ?                          review. ?Gatha Mayer, MD ?02/18/2022 9:17:53 AM ?This report has been signed electronically. ?

## 2022-02-18 NOTE — Progress Notes (Signed)
Called to room to assist during endoscopic procedure.  Patient ID and intended procedure confirmed with present staff. Received instructions for my participation in the procedure from the performing physician.  

## 2022-02-18 NOTE — Progress Notes (Signed)
Sedate, gd SR, tolerated procedure well, VSS, report to RN 

## 2022-02-22 ENCOUNTER — Telehealth: Payer: Self-pay

## 2022-02-22 NOTE — Telephone Encounter (Signed)
No answer, left message to call back later today, B.Broderic Bara RN. 

## 2022-02-22 NOTE — Telephone Encounter (Signed)
?  Follow up Call- ? ? ?  02/18/2022  ?  7:31 AM  ?Call back number  ?Post procedure Call Back phone  # [phone]  ?Permission to leave phone message Yes  ?  ? ?Patient questions: ? ?Do you have a fever, pain , or abdominal swelling? No. ?Pain Score  0 * ? ?Have you tolerated food without any problems? Yes.   ? ?Have you been able to return to your normal activities? Yes.   ? ?Do you have any questions about your discharge instructions: ?Diet   No. ?Medications  No. ?Follow up visit  No. ? ?Do you have questions or concerns about your Care? No. ? ?Actions: ?* If pain score is 4 or above: ?No action needed, pain <4. ? ? ?

## 2022-02-25 DIAGNOSIS — H5203 Hypermetropia, bilateral: Secondary | ICD-10-CM | POA: Diagnosis not present

## 2022-02-28 ENCOUNTER — Encounter: Payer: Self-pay | Admitting: Internal Medicine

## 2022-02-28 DIAGNOSIS — Z860100 Personal history of colon polyps, unspecified: Secondary | ICD-10-CM

## 2022-02-28 HISTORY — DX: Personal history of colonic polyps: Z86.010

## 2022-02-28 HISTORY — DX: Personal history of colon polyps, unspecified: Z86.0100

## 2022-04-18 ENCOUNTER — Other Ambulatory Visit (HOSPITAL_COMMUNITY): Payer: Self-pay

## 2022-04-19 ENCOUNTER — Other Ambulatory Visit (HOSPITAL_COMMUNITY): Payer: Self-pay

## 2022-04-26 ENCOUNTER — Other Ambulatory Visit (HOSPITAL_COMMUNITY): Payer: Self-pay

## 2022-05-23 ENCOUNTER — Other Ambulatory Visit (HOSPITAL_COMMUNITY): Payer: Self-pay

## 2022-06-24 ENCOUNTER — Other Ambulatory Visit (HOSPITAL_COMMUNITY): Payer: Self-pay

## 2022-06-27 ENCOUNTER — Other Ambulatory Visit (HOSPITAL_COMMUNITY): Payer: Self-pay

## 2022-07-04 ENCOUNTER — Other Ambulatory Visit (HOSPITAL_COMMUNITY): Payer: Self-pay

## 2022-09-09 ENCOUNTER — Other Ambulatory Visit (HOSPITAL_COMMUNITY): Payer: Self-pay

## 2022-09-13 ENCOUNTER — Other Ambulatory Visit (HOSPITAL_COMMUNITY): Payer: Self-pay

## 2022-09-14 ENCOUNTER — Other Ambulatory Visit (HOSPITAL_COMMUNITY): Payer: Self-pay

## 2022-09-14 MED ORDER — LO LOESTRIN FE 1 MG-10 MCG / 10 MCG PO TABS
1.0000 | ORAL_TABLET | Freq: Every day | ORAL | 4 refills | Status: DC
  Filled 2022-10-26: qty 112, 105d supply, fill #0
  Filled 2022-10-28: qty 84, 72d supply, fill #0
  Filled 2023-01-30: qty 112, 84d supply, fill #0
  Filled 2023-04-18: qty 112, 84d supply, fill #1
  Filled 2023-07-20: qty 112, 84d supply, fill #2

## 2022-09-19 ENCOUNTER — Other Ambulatory Visit: Payer: Self-pay | Admitting: Obstetrics and Gynecology

## 2022-09-19 DIAGNOSIS — Z1231 Encounter for screening mammogram for malignant neoplasm of breast: Secondary | ICD-10-CM

## 2022-09-22 ENCOUNTER — Encounter: Payer: Self-pay | Admitting: Family

## 2022-09-23 ENCOUNTER — Other Ambulatory Visit (HOSPITAL_COMMUNITY): Payer: Self-pay

## 2022-10-26 ENCOUNTER — Other Ambulatory Visit (HOSPITAL_COMMUNITY): Payer: Self-pay

## 2022-10-29 ENCOUNTER — Other Ambulatory Visit (HOSPITAL_COMMUNITY): Payer: Self-pay

## 2022-11-01 ENCOUNTER — Other Ambulatory Visit (HOSPITAL_COMMUNITY): Payer: Self-pay

## 2022-11-04 ENCOUNTER — Other Ambulatory Visit (HOSPITAL_COMMUNITY): Payer: Self-pay

## 2022-11-11 ENCOUNTER — Ambulatory Visit
Admission: RE | Admit: 2022-11-11 | Discharge: 2022-11-11 | Disposition: A | Discharge status: Home / Self Care | Payer: 59 | Source: Ambulatory Visit | Attending: Obstetrics and Gynecology | Admitting: Obstetrics and Gynecology

## 2022-11-11 DIAGNOSIS — Z1231 Encounter for screening mammogram for malignant neoplasm of breast: Secondary | ICD-10-CM | POA: Diagnosis not present

## 2022-12-09 ENCOUNTER — Encounter: Payer: 59 | Admitting: Family

## 2022-12-23 ENCOUNTER — Encounter: Payer: 59 | Admitting: Family

## 2022-12-30 ENCOUNTER — Encounter: Payer: Self-pay | Admitting: Family

## 2022-12-30 ENCOUNTER — Ambulatory Visit (INDEPENDENT_AMBULATORY_CARE_PROVIDER_SITE_OTHER): Payer: 59 | Admitting: Family

## 2022-12-30 VITALS — BP 116/72 | HR 78 | Resp 18 | Ht 59.0 in | Wt 119.2 lb

## 2022-12-30 DIAGNOSIS — Z0000 Encounter for general adult medical examination without abnormal findings: Secondary | ICD-10-CM | POA: Diagnosis not present

## 2022-12-30 DIAGNOSIS — Z13220 Encounter for screening for lipoid disorders: Secondary | ICD-10-CM

## 2022-12-30 LAB — COMPREHENSIVE METABOLIC PANEL
ALT: 28 U/L (ref 0–35)
AST: 28 U/L (ref 0–37)
Albumin: 4.1 g/dL (ref 3.5–5.2)
Alkaline Phosphatase: 86 U/L (ref 39–117)
BUN: 19 mg/dL (ref 6–23)
CO2: 23 mEq/L (ref 19–32)
Calcium: 9.2 mg/dL (ref 8.4–10.5)
Chloride: 105 mEq/L (ref 96–112)
Creatinine, Ser: 0.82 mg/dL (ref 0.40–1.20)
GFR: 85.73 mL/min (ref >60.00)
Glucose, Bld: 80 mg/dL (ref 70–99)
Potassium: 4.1 mEq/L (ref 3.5–5.1)
Sodium: 139 mEq/L (ref 135–145)
Total Bilirubin: 0.5 mg/dL (ref 0.2–1.2)
Total Protein: 6.7 g/dL (ref 6.0–8.3)

## 2022-12-30 LAB — CBC WITH DIFFERENTIAL/PLATELET
Basophils Absolute: 0 10*3/uL (ref 0.0–0.1)
Basophils Relative: 0.5 % (ref 0.0–3.0)
Eosinophils Absolute: 0 10*3/uL (ref 0.0–0.7)
Eosinophils Relative: 0.2 % (ref 0.0–5.0)
HCT: 43.8 % (ref 36.0–46.0)
Hemoglobin: 15.1 g/dL — ABNORMAL HIGH (ref 12.0–15.0)
Lymphocytes Relative: 31.2 % (ref 12.0–46.0)
Lymphs Abs: 1.9 10*3/uL (ref 0.7–4.0)
MCHC: 34.6 g/dL (ref 30.0–36.0)
MCV: 91.3 fl (ref 78.0–100.0)
Monocytes Absolute: 0.5 10*3/uL (ref 0.1–1.0)
Monocytes Relative: 8.3 % (ref 3.0–12.0)
Neutro Abs: 3.6 10*3/uL (ref 1.4–7.7)
Neutrophils Relative %: 59.8 % (ref 43.0–77.0)
Platelets: 196 10*3/uL (ref 150.0–400.0)
RBC: 4.8 Mil/uL (ref 3.87–5.11)
RDW: 12.9 % (ref 11.5–15.5)
WBC: 6.1 10*3/uL (ref 4.0–10.5)

## 2022-12-30 LAB — LIPID PANEL
Cholesterol: 158 mg/dL (ref 0–200)
HDL: 51 mg/dL (ref >39.00)
LDL Cholesterol: 92 mg/dL (ref 0–99)
NonHDL: 106.68
Total CHOL/HDL Ratio: 3
Triglycerides: 71 mg/dL (ref 0.0–149.0)
VLDL: 14.2 mg/dL (ref 0.0–40.0)

## 2022-12-30 LAB — TSH: TSH: 3.83 u[IU]/mL (ref 0.35–5.50)

## 2022-12-30 NOTE — Progress Notes (Signed)
Michaela Mitchell is a 47 y.o. 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     B Complex-C (SUPER B COMPLEX PO) Take 1 tablet by mouth daily.     loratadine (CLARITIN) 10 MG tablet Take 10 mg by mouth daily.     Multiple Vitamin (MULTIVITAMIN) tablet Take 1 tablet by mouth daily. Multi Vitamin One A Day Women's     nitrofurantoin (MACRODANTIN) 50 MG capsule Take 1 capsule by mouth post intercourse as needed 30 capsule 3   Norethindrone-Ethinyl Estradiol-Fe Biphas (LO LOESTRIN FE) 1 MG-10 MCG / 10 MCG tablet Take 1 tablet by mouth daily continuously skipping placebos. 112 tablet 4   No current facility-administered medications for this visit.    Allergies: Sulfa antibiotics and Celebrex [celecoxib]  Past Medical History:  Diagnosis Date   History of colonic polyp 02/28/2022    Past Surgical History:  Procedure Laterality Date   WISDOM TOOTH EXTRACTION  2007    Family History  Problem Relation Age of Onset   Arthritis Mother        rheumatoid arthritis   Hypertension Mother    Diabetes Mother    COPD Mother    Alcohol abuse Father    Heart disease Father        MI   Breast cancer Neg Hx    Colon cancer Neg Hx    Esophageal cancer Neg Hx    Rectal cancer Neg Hx    Stomach cancer Neg Hx     Social History   Tobacco Use   Smoking status: Never   Smokeless tobacco: Never  Substance Use Topics   Alcohol use: Yes    Comment: Rare     Subjective:   Presents for yearly CPE: accompanied by husband; does see GYN regularly- scheduled to go next month;  Has been working very hard on lifestyle changes- "feeling very good."  Review of Systems  Constitutional:  Positive for weight loss.       Planned weight loss  HENT: Negative.    Eyes: Negative.   Respiratory: Negative.    Cardiovascular: Negative.   Gastrointestinal: Negative.   Genitourinary: Negative.   Musculoskeletal: Negative.   Skin: Negative.   Neurological: Negative.  Loss of consciousness: well.  Endo/Heme/Allergies: Negative.   Psychiatric/Behavioral: Negative.        Objective:  Vitals:   12/30/22 0911  BP: 116/72  Pulse: 78  Resp: 18  SpO2: 99%  Weight: 119 lb 3.2 oz (54.1 kg)  Height: '4\' 11"'$  (1.499 m)    General: Well developed, well nourished, in no acute distress  Skin : Warm and dry.  Head: Normocephalic and atraumatic  Eyes: Sclera and conjunctiva clear; pupils round and reactive to light; extraocular movements intact  Ears: External normal; canals clear; tympanic membranes normal  Oropharynx: Pink, supple. No suspicious lesions  Neck: Supple without thyromegaly, adenopathy  Lungs: Respirations unlabored; clear to auscultation bilaterally without wheeze, rales,  rhonchi  CVS exam: normal rate and regular rhythm.  Musculoskeletal: No deformities; no active joint inflammation  Extremities: No edema, cyanosis, clubbing  Vessels: Symmetric bilaterally  Neurologic: Alert and oriented; speech intact; face symmetrical; moves all extremities well; CNII-XII intact without focal deficit   Assessment:  1. PE (physical exam), annual   2. Lipid screening     Plan:  Congratulated patient on commitment to her health; Age appropriate preventive healthcare needs addressed; encouraged regular eye doctor and dental exams; encouraged regular exercise; will update labs and refills as needed today; follow-up to be determined;   No follow-ups on file.   Orders Placed This Encounter  Procedures   CBC with Differential/Platelet   Comp Met (CMET)   Lipid panel   TSH    Requested Prescriptions    No prescriptions requested or ordered in this encounter

## 2023-01-11 ENCOUNTER — Other Ambulatory Visit (HOSPITAL_COMMUNITY): Payer: Self-pay

## 2023-01-30 ENCOUNTER — Other Ambulatory Visit: Payer: Self-pay

## 2023-01-30 ENCOUNTER — Other Ambulatory Visit (HOSPITAL_COMMUNITY): Payer: Self-pay

## 2023-02-03 ENCOUNTER — Other Ambulatory Visit (HOSPITAL_COMMUNITY): Payer: Self-pay

## 2023-02-03 DIAGNOSIS — L710 Perioral dermatitis: Secondary | ICD-10-CM | POA: Diagnosis not present

## 2023-02-03 DIAGNOSIS — L719 Rosacea, unspecified: Secondary | ICD-10-CM | POA: Diagnosis not present

## 2023-02-03 DIAGNOSIS — L700 Acne vulgaris: Secondary | ICD-10-CM | POA: Diagnosis not present

## 2023-02-04 ENCOUNTER — Other Ambulatory Visit (HOSPITAL_COMMUNITY): Payer: Self-pay

## 2023-02-06 ENCOUNTER — Other Ambulatory Visit (HOSPITAL_COMMUNITY): Payer: Self-pay

## 2023-02-06 MED ORDER — SEYSARA 60 MG PO TABS
60.0000 mg | ORAL_TABLET | Freq: Every day | ORAL | 3 refills | Status: DC
  Filled 2023-02-06: qty 30, 30d supply, fill #0

## 2023-02-08 ENCOUNTER — Other Ambulatory Visit (HOSPITAL_COMMUNITY): Payer: Self-pay

## 2023-02-17 ENCOUNTER — Other Ambulatory Visit (HOSPITAL_COMMUNITY): Payer: Self-pay

## 2023-02-17 DIAGNOSIS — Z309 Encounter for contraceptive management, unspecified: Secondary | ICD-10-CM | POA: Diagnosis not present

## 2023-02-17 DIAGNOSIS — Z780 Asymptomatic menopausal state: Secondary | ICD-10-CM | POA: Diagnosis not present

## 2023-02-17 DIAGNOSIS — Z6824 Body mass index (BMI) 24.0-24.9, adult: Secondary | ICD-10-CM | POA: Diagnosis not present

## 2023-02-17 DIAGNOSIS — Z01419 Encounter for gynecological examination (general) (routine) without abnormal findings: Secondary | ICD-10-CM | POA: Diagnosis not present

## 2023-02-17 MED ORDER — LO LOESTRIN FE 1 MG-10 MCG / 10 MCG PO TABS
1.0000 | ORAL_TABLET | Freq: Every day | ORAL | 4 refills | Status: AC
  Filled 2023-02-17 – 2023-03-01 (×2): qty 112, 84d supply, fill #0
  Filled 2023-10-20: qty 35, 35d supply, fill #0
  Filled 2024-02-12: qty 112, 84d supply, fill #0

## 2023-03-01 ENCOUNTER — Other Ambulatory Visit (HOSPITAL_COMMUNITY): Payer: Self-pay

## 2023-03-14 ENCOUNTER — Other Ambulatory Visit: Payer: Self-pay

## 2023-03-14 ENCOUNTER — Other Ambulatory Visit (HOSPITAL_COMMUNITY): Payer: Self-pay

## 2023-03-14 MED ORDER — VALACYCLOVIR HCL 500 MG PO TABS
500.0000 mg | ORAL_TABLET | Freq: Two times a day (BID) | ORAL | 1 refills | Status: DC
  Filled 2023-03-14: qty 30, 15d supply, fill #0

## 2023-03-29 ENCOUNTER — Other Ambulatory Visit: Payer: Self-pay

## 2023-04-18 ENCOUNTER — Other Ambulatory Visit: Payer: Self-pay

## 2023-05-03 ENCOUNTER — Encounter: Payer: Self-pay | Admitting: Family

## 2023-05-04 NOTE — Telephone Encounter (Signed)
Rx has been sent in. 

## 2023-05-26 ENCOUNTER — Other Ambulatory Visit (HOSPITAL_COMMUNITY): Payer: Self-pay

## 2023-05-26 ENCOUNTER — Other Ambulatory Visit: Payer: Self-pay

## 2023-05-26 MED ORDER — METRONIDAZOLE 0.75 % EX CREA
TOPICAL_CREAM | CUTANEOUS | 1 refills | Status: AC
  Filled 2023-05-26: qty 45, 30d supply, fill #0

## 2023-06-07 ENCOUNTER — Other Ambulatory Visit: Payer: Self-pay | Admitting: Oncology

## 2023-06-07 DIAGNOSIS — Z006 Encounter for examination for normal comparison and control in clinical research program: Secondary | ICD-10-CM

## 2023-06-22 ENCOUNTER — Other Ambulatory Visit (HOSPITAL_COMMUNITY)
Admission: AD | Admit: 2023-06-22 | Discharge: 2023-06-22 | Disposition: A | Discharge status: Home / Self Care | Payer: 59 | Source: Ambulatory Visit | Attending: Oncology | Admitting: Oncology

## 2023-06-22 DIAGNOSIS — Z006 Encounter for examination for normal comparison and control in clinical research program: Secondary | ICD-10-CM | POA: Insufficient documentation

## 2023-06-26 DIAGNOSIS — H524 Presbyopia: Secondary | ICD-10-CM | POA: Diagnosis not present

## 2023-07-20 ENCOUNTER — Other Ambulatory Visit (HOSPITAL_COMMUNITY): Payer: Self-pay

## 2023-08-16 ENCOUNTER — Other Ambulatory Visit (HOSPITAL_COMMUNITY): Payer: Self-pay

## 2023-08-16 MED ORDER — NITROFURANTOIN MACROCRYSTAL 50 MG PO CAPS
ORAL_CAPSULE | ORAL | 3 refills | Status: DC
  Filled 2023-08-16 (×2): qty 30, 30d supply, fill #0
  Filled 2023-10-18: qty 30, 30d supply, fill #1
  Filled 2024-04-29 (×2): qty 30, 30d supply, fill #0

## 2023-08-17 ENCOUNTER — Other Ambulatory Visit (HOSPITAL_COMMUNITY): Payer: Self-pay

## 2023-09-20 ENCOUNTER — Other Ambulatory Visit: Payer: Self-pay | Admitting: Family

## 2023-09-20 DIAGNOSIS — Z1231 Encounter for screening mammogram for malignant neoplasm of breast: Secondary | ICD-10-CM

## 2023-10-18 ENCOUNTER — Other Ambulatory Visit (HOSPITAL_COMMUNITY): Payer: Self-pay

## 2023-10-18 ENCOUNTER — Other Ambulatory Visit: Payer: Self-pay

## 2023-10-18 MED ORDER — LO LOESTRIN FE 1 MG-10 MCG / 10 MCG PO TABS
1.0000 | ORAL_TABLET | Freq: Every day | ORAL | 4 refills | Status: DC
  Filled 2023-10-18: qty 112, 84d supply, fill #0
  Filled 2023-10-18: qty 28, 21d supply, fill #0
  Filled 2023-10-20: qty 56, 56d supply, fill #1
  Filled 2023-11-09: qty 112, 84d supply, fill #1
  Filled 2024-06-07 (×2): qty 112, 84d supply, fill #2
  Filled 2024-09-01: qty 112, 84d supply, fill #3

## 2023-10-19 ENCOUNTER — Other Ambulatory Visit: Payer: Self-pay

## 2023-10-20 ENCOUNTER — Other Ambulatory Visit (HOSPITAL_COMMUNITY): Payer: Self-pay

## 2023-11-09 ENCOUNTER — Other Ambulatory Visit: Payer: Self-pay

## 2023-11-17 ENCOUNTER — Ambulatory Visit
Admission: RE | Admit: 2023-11-17 | Discharge: 2023-11-17 | Disposition: A | Discharge status: Home / Self Care | Payer: 59 | Source: Ambulatory Visit | Attending: Family | Admitting: Family

## 2023-11-17 DIAGNOSIS — Z1231 Encounter for screening mammogram for malignant neoplasm of breast: Secondary | ICD-10-CM

## 2023-11-23 ENCOUNTER — Other Ambulatory Visit: Payer: Self-pay | Admitting: Family

## 2023-11-23 DIAGNOSIS — R928 Other abnormal and inconclusive findings on diagnostic imaging of breast: Secondary | ICD-10-CM

## 2023-12-12 ENCOUNTER — Other Ambulatory Visit (HOSPITAL_COMMUNITY): Payer: Self-pay

## 2023-12-14 ENCOUNTER — Other Ambulatory Visit: Payer: Self-pay

## 2023-12-14 ENCOUNTER — Other Ambulatory Visit (HOSPITAL_COMMUNITY): Payer: Self-pay

## 2023-12-14 MED ORDER — METRONIDAZOLE 0.75 % EX CREA
1.0000 | TOPICAL_CREAM | Freq: Two times a day (BID) | CUTANEOUS | 3 refills | Status: AC
  Filled 2023-12-14: qty 45, 30d supply, fill #0
  Filled 2024-09-05: qty 45, 30d supply, fill #1

## 2023-12-15 ENCOUNTER — Other Ambulatory Visit: Payer: Self-pay | Admitting: Family

## 2023-12-15 ENCOUNTER — Ambulatory Visit
Admission: RE | Admit: 2023-12-15 | Discharge: 2023-12-15 | Disposition: A | Discharge status: Home / Self Care | Payer: 59 | Source: Ambulatory Visit | Attending: Family | Admitting: Family

## 2023-12-15 DIAGNOSIS — N6320 Unspecified lump in the left breast, unspecified quadrant: Secondary | ICD-10-CM

## 2023-12-15 DIAGNOSIS — R928 Other abnormal and inconclusive findings on diagnostic imaging of breast: Secondary | ICD-10-CM | POA: Diagnosis not present

## 2023-12-15 DIAGNOSIS — N6489 Other specified disorders of breast: Secondary | ICD-10-CM

## 2023-12-23 ENCOUNTER — Other Ambulatory Visit (HOSPITAL_COMMUNITY): Payer: Self-pay

## 2023-12-25 ENCOUNTER — Other Ambulatory Visit (HOSPITAL_COMMUNITY): Payer: Self-pay

## 2023-12-25 ENCOUNTER — Other Ambulatory Visit: Payer: Self-pay

## 2023-12-25 MED ORDER — VALACYCLOVIR HCL 1 G PO TABS
1000.0000 mg | ORAL_TABLET | Freq: Every day | ORAL | 0 refills | Status: DC
  Filled 2023-12-25: qty 30, 30d supply, fill #0

## 2023-12-26 ENCOUNTER — Other Ambulatory Visit (HOSPITAL_COMMUNITY): Payer: Self-pay

## 2024-01-12 ENCOUNTER — Encounter: Payer: Self-pay | Admitting: Family

## 2024-01-12 ENCOUNTER — Ambulatory Visit: Payer: 59 | Admitting: Family

## 2024-01-12 VITALS — BP 112/64 | HR 70 | Temp 98.1°F | Ht 59.0 in | Wt 120.8 lb

## 2024-01-12 DIAGNOSIS — Z13220 Encounter for screening for lipoid disorders: Secondary | ICD-10-CM | POA: Diagnosis not present

## 2024-01-12 DIAGNOSIS — F99 Mental disorder, not otherwise specified: Secondary | ICD-10-CM | POA: Diagnosis not present

## 2024-01-12 DIAGNOSIS — Z0000 Encounter for general adult medical examination without abnormal findings: Secondary | ICD-10-CM

## 2024-01-12 LAB — COMPREHENSIVE METABOLIC PANEL
ALT: 13 U/L (ref 0–35)
AST: 17 U/L (ref 0–37)
Albumin: 4.2 g/dL (ref 3.5–5.2)
Alkaline Phosphatase: 64 U/L (ref 39–117)
BUN: 15 mg/dL (ref 6–23)
CO2: 28 meq/L (ref 19–32)
Calcium: 9 mg/dL (ref 8.4–10.5)
Chloride: 102 meq/L (ref 96–112)
Creatinine, Ser: 0.87 mg/dL (ref 0.40–1.20)
GFR: 79.28 mL/min (ref >60.00)
Glucose, Bld: 79 mg/dL (ref 70–99)
Potassium: 4 meq/L (ref 3.5–5.1)
Sodium: 140 meq/L (ref 135–145)
Total Bilirubin: 0.6 mg/dL (ref 0.2–1.2)
Total Protein: 6.6 g/dL (ref 6.0–8.3)

## 2024-01-12 LAB — LIPID PANEL
Cholesterol: 144 mg/dL (ref 0–200)
HDL: 57.6 mg/dL (ref >39.00)
LDL Cholesterol: 75 mg/dL (ref 0–99)
NonHDL: 86.51
Total CHOL/HDL Ratio: 3
Triglycerides: 56 mg/dL (ref 0.0–149.0)
VLDL: 11.2 mg/dL (ref 0.0–40.0)

## 2024-01-12 LAB — CBC WITH DIFFERENTIAL/PLATELET
Basophils Absolute: 0 10*3/uL (ref 0.0–0.1)
Basophils Relative: 0.6 % (ref 0.0–3.0)
Eosinophils Absolute: 0 10*3/uL (ref 0.0–0.7)
Eosinophils Relative: 0.2 % (ref 0.0–5.0)
HCT: 44.3 % (ref 36.0–46.0)
Hemoglobin: 15.1 g/dL — ABNORMAL HIGH (ref 12.0–15.0)
Lymphocytes Relative: 30.8 % (ref 12.0–46.0)
Lymphs Abs: 1.9 10*3/uL (ref 0.7–4.0)
MCHC: 34.1 g/dL (ref 30.0–36.0)
MCV: 92.1 fL (ref 78.0–100.0)
Monocytes Absolute: 0.5 10*3/uL (ref 0.1–1.0)
Monocytes Relative: 7.7 % (ref 3.0–12.0)
Neutro Abs: 3.7 10*3/uL (ref 1.4–7.7)
Neutrophils Relative %: 60.7 % (ref 43.0–77.0)
Platelets: 200 10*3/uL (ref 150.0–400.0)
RBC: 4.81 Mil/uL (ref 3.87–5.11)
RDW: 12.9 % (ref 11.5–15.5)
WBC: 6.1 10*3/uL (ref 4.0–10.5)

## 2024-01-12 NOTE — Patient Instructions (Addendum)
[email or web address] Clydene Laming)

## 2024-01-12 NOTE — Progress Notes (Signed)
Michaela Mitchell is a 48 y.o. 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     B Complex-C (SUPER B COMPLEX PO) Take 1 tablet by mouth daily.     loratadine (CLARITIN) 10 MG tablet Take 10 mg by mouth daily.     metroNIDAZOLE (METROCREAM) 0.75 % cream Apply a small amount to face 2 times a day 45 g 1   metroNIDAZOLE (METROCREAM) 0.75 % cream Apply 1 small Application topically to face 2 (two) times daily. 45 g 3   Multiple Vitamin (MULTIVITAMIN) tablet Take 1 tablet by mouth daily. Multi Vitamin One A Day Women's     nitrofurantoin (MACRODANTIN) 50 MG capsule Take 1 capsule by mouth post intercourse as needed 30 capsule 3   Norethindrone-Ethinyl Estradiol-Fe Biphas (LO LOESTRIN FE) 1 MG-10 MCG / 10 MCG tablet Take 1 tablet by mouth daily continuously, skipping placebos. 112 tablet 4   Norethindrone-Ethinyl Estradiol-Fe Biphas (LO LOESTRIN FE) 1 MG-10 MCG / 10 MCG tablet Take 1 tablet by mouth daily continuously skipping placebos. 112 tablet 4   valACYclovir (VALTREX) 1000 MG tablet Take 1 tablet (1,000 mg total) by mouth daily for 5 days. 30 tablet 0   No current facility-administered medications for this visit.    Allergies: Sulfa antibiotics and Celebrex [celecoxib]  Past Medical History:  Diagnosis Date   History of colonic polyp 02/28/2022    Past Surgical History:  Procedure Laterality Date   WISDOM TOOTH EXTRACTION  2007    Family History   Problem Relation Age of Onset   Arthritis Mother        rheumatoid arthritis   Hypertension Mother    Diabetes Mother    COPD Mother    Alcohol abuse Father    Heart disease Father        MI   Breast cancer Neg Hx    Colon cancer Neg Hx    Esophageal cancer Neg Hx    Rectal cancer Neg Hx    Stomach cancer Neg Hx     Social History   Tobacco Use   Smoking status: Never   Smokeless tobacco: Never  Substance Use Topics   Alcohol use: Yes    Comment: Rare    Subjective:   Presents for yearly CPE; husband is present for appointment today; does work with GYN regularly; dermatology in [place] for management of rosacea;   Review of Systems  Constitutional: Negative.   HENT: Negative.    Eyes: Negative.   Respiratory: Negative.    Cardiovascular: Negative.   Gastrointestinal: Negative.   Genitourinary: Negative.   Musculoskeletal: Negative.   Skin: Negative.   Neurological: Negative.   Endo/Heme/Allergies: Negative.   Psychiatric/Behavioral: Negative.        Objective:  Vitals:   01/12/24 0836  BP: 112/64  Pulse: 70  Temp: 98.1 F (36.7 C)  TempSrc: Oral  SpO2: 100%  Weight: 120 lb 12.8 oz (54.8 kg)  Height:  4\' 11"  (1.499 m)    General: Well developed, well nourished, in no acute distress  Skin : Warm and dry.  Head: Normocephalic and atraumatic  Eyes: Sclera and conjunctiva clear; pupils round and reactive to light; extraocular movements intact  Ears: External normal; canals clear; tympanic membranes normal  Oropharynx: Pink, supple. No suspicious lesions  Neck: Supple without thyromegaly, adenopathy  Lungs: Respirations unlabored; clear to auscultation bilaterally without wheeze, rales, rhonchi  CVS exam: normal rate and regular rhythm.  Abdomen: Soft; nontender; nondistended; normoactive bowel sounds; no masses or hepatosplenomegaly  Musculoskeletal: No deformities; no active joint inflammation  Extremities: No edema, cyanosis, clubbing  Vessels:  Symmetric bilaterally  Neurologic: Alert and oriented; speech intact; face symmetrical; moves all extremities well; CNII-XII intact without focal deficit   Assessment:  1. Annual physical examination for person with mental illness completed   2. Lipid screening     Plan:  Age appropriate preventive healthcare needs addressed; encouraged regular eye doctor and dental exams; encouraged regular exercise; will update labs and refills as needed today; follow-up in 1 year, sooner prn.    No follow-ups on file.  Orders Placed This Encounter  Procedures   CBC with Differential/Platelet   Comp Met (CMET)   Lipid panel    Requested Prescriptions    No prescriptions requested or ordered in this encounter

## 2024-02-12 ENCOUNTER — Other Ambulatory Visit: Payer: Self-pay

## 2024-02-12 ENCOUNTER — Other Ambulatory Visit (HOSPITAL_COMMUNITY): Payer: Self-pay

## 2024-02-16 ENCOUNTER — Ambulatory Visit (INDEPENDENT_AMBULATORY_CARE_PROVIDER_SITE_OTHER): Admitting: Podiatry

## 2024-02-16 ENCOUNTER — Ambulatory Visit (INDEPENDENT_AMBULATORY_CARE_PROVIDER_SITE_OTHER)

## 2024-02-16 DIAGNOSIS — M21619 Bunion of unspecified foot: Secondary | ICD-10-CM | POA: Diagnosis not present

## 2024-02-16 DIAGNOSIS — M7751 Other enthesopathy of right foot: Secondary | ICD-10-CM | POA: Diagnosis not present

## 2024-02-16 DIAGNOSIS — M21611 Bunion of right foot: Secondary | ICD-10-CM | POA: Diagnosis not present

## 2024-02-18 NOTE — Progress Notes (Signed)
      Chief Complaint  Patient presents with   Bunions    Right foot bunion pain, pain is right at the joint and will radiates across to the lateral side. She was doing climbing exercises yesterday and noticed the pain was really bad.  Not diabetic and no anti coag.    HPI: 48 y.o. female presents today for bunion evaluation.  She is having pain at the great toe joint right foot.  States it was aggravated with a workout yesterday.    Past Medical History:  Diagnosis Date   History of colonic polyp 02/28/2022    Past Surgical History:  Procedure Laterality Date   WISDOM TOOTH EXTRACTION  2007    Allergies  Allergen Reactions   Sulfa Antibiotics     Just Celebrex   Celebrex [Celecoxib] Rash    Review of Systems  Musculoskeletal:  Positive for joint pain.     PHYSICAL EXAM: General: The patient is alert and oriented x3 in no acute distress.  Dermatology: Skin is warm, dry and supple bilateral lower extremities. Interspaces are clear of maceration and debris.  No rashes noted.   Vascular: Palpable pedal pulses bilaterally. Capillary refill within normal limits.  No appreciable edema.  No erythema or calor.  Neurological: Light touch sensation grossly intact bilateral feet.   Musculoskeletal Exam:  There is a bony medial prominence on the dorsomedial aspect of the 1st metatarsal head of the right foot.  There is pain on palpation of the "bump" in this area.  Mild lateral deviation of the hallux at the MPJ level.  1st MPJ ROM is wnl.  No crepitus.  Hallux is tracking, not trackbound.    RADIOGRAPHIC EXAM:  Increased first intermetatarsal angle 12 degrees.   Enlargement of bone at dorsomedial 1st metatarsal head.  Joint space is preserved.  Tibial sesamoid position is 4.   ASSESSMENT/PLAN OF CARE: 1. Bunion   2. Capsulitis of metatarsophalangeal (MTP) joint of right foot    Discussed patient's condition and possible etiologies today.  Discussed conservative treatment options  with patient today, including shoe modification / arch supports, off-loading, cortisone injectione, NSAID topical / oral therapy, and toe splints and shields.  Briefly discussed surgical intervention if conservative options are not successful.    Recommend Voltaren gel BID to bunion area.  OTC NSAIDs as needed for continued pain.  May ice area after workouts.    Discussed shoe gear and need to accommodate bunion in shoes to prevent increased pressure.  Also recommended Powerstep arch supports in sneakers.   Toe spacers and toe alignment splint fitted and dispensed today.  No Ace wrap necessary.   Briefly discussed surgical intervention if conservative measures are unsuccessful.   Return if symptoms worsen or fail to improve.    Clerance Lav, DPM, FACFAS [facility]     [address] [place], [place] [address]                Office [phone]  Fax [phone]

## 2024-02-23 ENCOUNTER — Other Ambulatory Visit: Payer: Self-pay

## 2024-02-23 ENCOUNTER — Other Ambulatory Visit (HOSPITAL_COMMUNITY): Payer: Self-pay

## 2024-02-23 DIAGNOSIS — Z01419 Encounter for gynecological examination (general) (routine) without abnormal findings: Secondary | ICD-10-CM | POA: Diagnosis not present

## 2024-02-23 DIAGNOSIS — N907 Vulvar cyst: Secondary | ICD-10-CM | POA: Diagnosis not present

## 2024-02-23 DIAGNOSIS — Z6824 Body mass index (BMI) 24.0-24.9, adult: Secondary | ICD-10-CM | POA: Diagnosis not present

## 2024-02-23 DIAGNOSIS — Z309 Encounter for contraceptive management, unspecified: Secondary | ICD-10-CM | POA: Diagnosis not present

## 2024-02-23 DIAGNOSIS — B009 Herpesviral infection, unspecified: Secondary | ICD-10-CM | POA: Diagnosis not present

## 2024-02-23 MED ORDER — VALACYCLOVIR HCL 1 G PO TABS
1.0000 g | ORAL_TABLET | Freq: Every day | ORAL | 0 refills | Status: AC
  Filled 2024-02-23: qty 30, 30d supply, fill #0

## 2024-02-23 MED ORDER — LO LOESTRIN FE 1 MG-10 MCG / 10 MCG PO TABS: 1.0000 | ORAL_TABLET | Freq: Every day | ORAL | 4 refills | Status: AC

## 2024-03-12 ENCOUNTER — Other Ambulatory Visit: Payer: Self-pay

## 2024-03-18 DIAGNOSIS — H04123 Dry eye syndrome of bilateral lacrimal glands: Secondary | ICD-10-CM | POA: Diagnosis not present

## 2024-03-18 DIAGNOSIS — H5203 Hypermetropia, bilateral: Secondary | ICD-10-CM | POA: Diagnosis not present

## 2024-03-18 DIAGNOSIS — H52221 Regular astigmatism, right eye: Secondary | ICD-10-CM | POA: Diagnosis not present

## 2024-04-29 ENCOUNTER — Other Ambulatory Visit (HOSPITAL_COMMUNITY): Payer: Self-pay

## 2024-04-29 ENCOUNTER — Other Ambulatory Visit: Payer: Self-pay

## 2024-04-29 ENCOUNTER — Other Ambulatory Visit (HOSPITAL_BASED_OUTPATIENT_CLINIC_OR_DEPARTMENT_OTHER): Payer: Self-pay

## 2024-06-07 ENCOUNTER — Other Ambulatory Visit (HOSPITAL_COMMUNITY): Payer: Self-pay

## 2024-06-14 ENCOUNTER — Ambulatory Visit
Admission: RE | Admit: 2024-06-14 | Discharge: 2024-06-14 | Disposition: A | Discharge status: Home / Self Care | Payer: 59 | Source: Ambulatory Visit | Attending: Family | Admitting: Family

## 2024-06-14 DIAGNOSIS — N6320 Unspecified lump in the left breast, unspecified quadrant: Secondary | ICD-10-CM

## 2024-06-14 DIAGNOSIS — N6489 Other specified disorders of breast: Secondary | ICD-10-CM

## 2024-06-14 DIAGNOSIS — N6321 Unspecified lump in the left breast, upper outer quadrant: Secondary | ICD-10-CM | POA: Diagnosis not present

## 2024-06-17 ENCOUNTER — Other Ambulatory Visit: Payer: Self-pay | Admitting: Family

## 2024-06-17 DIAGNOSIS — N6320 Unspecified lump in the left breast, unspecified quadrant: Secondary | ICD-10-CM

## 2024-08-23 ENCOUNTER — Other Ambulatory Visit (HOSPITAL_COMMUNITY): Payer: Self-pay

## 2024-08-26 ENCOUNTER — Other Ambulatory Visit (HOSPITAL_COMMUNITY): Payer: Self-pay

## 2024-08-26 ENCOUNTER — Other Ambulatory Visit: Payer: Self-pay

## 2024-08-26 MED ORDER — NITROFURANTOIN MACROCRYSTAL 50 MG PO CAPS
50.0000 mg | ORAL_CAPSULE | Freq: Every day | ORAL | 3 refills | Status: AC | PRN
  Filled 2024-08-26: qty 30, 30d supply, fill #0

## 2024-09-02 ENCOUNTER — Other Ambulatory Visit (HOSPITAL_COMMUNITY): Payer: Self-pay

## 2024-09-02 ENCOUNTER — Other Ambulatory Visit: Payer: Self-pay

## 2024-09-05 ENCOUNTER — Other Ambulatory Visit (HOSPITAL_COMMUNITY): Payer: Self-pay

## 2024-11-01 ENCOUNTER — Other Ambulatory Visit (HOSPITAL_COMMUNITY): Payer: Self-pay

## 2024-12-09 ENCOUNTER — Other Ambulatory Visit (HOSPITAL_COMMUNITY): Payer: Self-pay

## 2024-12-10 ENCOUNTER — Other Ambulatory Visit: Payer: Self-pay

## 2024-12-10 ENCOUNTER — Other Ambulatory Visit (HOSPITAL_COMMUNITY): Payer: Self-pay

## 2024-12-10 MED ORDER — LO LOESTRIN FE 1 MG-10 MCG / 10 MCG PO TABS
1.0000 | ORAL_TABLET | Freq: Every day | ORAL | 1 refills | Status: AC
  Filled 2024-12-10: qty 84, 63d supply, fill #0

## 2024-12-20 ENCOUNTER — Ambulatory Visit
Admission: RE | Admit: 2024-12-20 | Discharge: 2024-12-20 | Disposition: A | Source: Ambulatory Visit | Attending: Family | Admitting: Family

## 2024-12-20 DIAGNOSIS — N6320 Unspecified lump in the left breast, unspecified quadrant: Secondary | ICD-10-CM

## 2025-01-22 ENCOUNTER — Encounter: Admitting: Family Medicine
